# Patient Record
Sex: Female | Born: 1945 | Race: White | Hispanic: No | Marital: Married | Smoking: Never smoker
Health system: Southern US, Community
[De-identification: ages and names within clinical notes are randomized; demographics above are authoritative.]

## PROBLEM LIST (undated history)

## (undated) DIAGNOSIS — E041 Nontoxic single thyroid nodule: Secondary | ICD-10-CM

## (undated) DIAGNOSIS — C8590 Non-Hodgkin lymphoma, unspecified, unspecified site: Secondary | ICD-10-CM

## (undated) DIAGNOSIS — K5792 Diverticulitis of intestine, part unspecified, without perforation or abscess without bleeding: Secondary | ICD-10-CM

## (undated) DIAGNOSIS — G4733 Obstructive sleep apnea (adult) (pediatric): Secondary | ICD-10-CM

## (undated) DIAGNOSIS — I10 Essential (primary) hypertension: Secondary | ICD-10-CM

## (undated) DIAGNOSIS — E039 Hypothyroidism, unspecified: Secondary | ICD-10-CM

## (undated) DIAGNOSIS — K219 Gastro-esophageal reflux disease without esophagitis: Secondary | ICD-10-CM

## (undated) DIAGNOSIS — G4700 Insomnia, unspecified: Secondary | ICD-10-CM

## (undated) DIAGNOSIS — F329 Major depressive disorder, single episode, unspecified: Secondary | ICD-10-CM

## (undated) DIAGNOSIS — K449 Diaphragmatic hernia without obstruction or gangrene: Secondary | ICD-10-CM

## (undated) DIAGNOSIS — R350 Frequency of micturition: Secondary | ICD-10-CM

## (undated) DIAGNOSIS — N390 Urinary tract infection, site not specified: Secondary | ICD-10-CM

## (undated) DIAGNOSIS — R351 Nocturia: Secondary | ICD-10-CM

## (undated) DIAGNOSIS — M1990 Unspecified osteoarthritis, unspecified site: Secondary | ICD-10-CM

## (undated) DIAGNOSIS — E785 Hyperlipidemia, unspecified: Secondary | ICD-10-CM

## (undated) DIAGNOSIS — J189 Pneumonia, unspecified organism: Secondary | ICD-10-CM

## (undated) DIAGNOSIS — J40 Bronchitis, not specified as acute or chronic: Secondary | ICD-10-CM

## (undated) DIAGNOSIS — F32A Depression, unspecified: Secondary | ICD-10-CM

## (undated) DIAGNOSIS — Z9989 Dependence on other enabling machines and devices: Secondary | ICD-10-CM

## (undated) DIAGNOSIS — R3915 Urgency of urination: Secondary | ICD-10-CM

## (undated) DIAGNOSIS — R32 Unspecified urinary incontinence: Secondary | ICD-10-CM

## (undated) DIAGNOSIS — G4730 Sleep apnea, unspecified: Secondary | ICD-10-CM

## (undated) DIAGNOSIS — G43909 Migraine, unspecified, not intractable, without status migrainosus: Secondary | ICD-10-CM

## (undated) DIAGNOSIS — N3642 Intrinsic sphincter deficiency (ISD): Secondary | ICD-10-CM

## (undated) HISTORY — PX: COLONOSCOPY: SHX174

## (undated) HISTORY — PX: BUNIONECTOMY: SHX129

## (undated) HISTORY — PX: TONSILLECTOMY: SUR1361

## (undated) HISTORY — DX: Hypothyroidism, unspecified: E03.9

## (undated) HISTORY — DX: Non-Hodgkin lymphoma, unspecified, unspecified site: C85.90

## (undated) HISTORY — DX: Diaphragmatic hernia without obstruction or gangrene: K44.9

## (undated) HISTORY — DX: Diverticulitis of intestine, part unspecified, without perforation or abscess without bleeding: K57.92

## (undated) HISTORY — DX: Sleep apnea, unspecified: G47.30

---

## 1987-06-10 HISTORY — PX: CHOLECYSTECTOMY: SHX55

## 1992-06-09 HISTORY — PX: ANTERIOR AND POSTERIOR REPAIR: SHX1172

## 1998-01-10 ENCOUNTER — Other Ambulatory Visit: Admission: RE | Admit: 1998-01-10 | Discharge: 1998-01-10 | Payer: Self-pay | Admitting: Gynecology

## 1999-02-04 ENCOUNTER — Other Ambulatory Visit: Admission: RE | Admit: 1999-02-04 | Discharge: 1999-02-04 | Payer: Self-pay | Admitting: Gynecology

## 1999-11-28 ENCOUNTER — Encounter: Payer: Self-pay | Admitting: Gynecology

## 1999-11-28 ENCOUNTER — Encounter: Admission: RE | Admit: 1999-11-28 | Discharge: 1999-11-28 | Payer: Self-pay | Admitting: Gynecology

## 2000-02-06 ENCOUNTER — Other Ambulatory Visit: Admission: RE | Admit: 2000-02-06 | Discharge: 2000-02-06 | Payer: Self-pay | Admitting: Gynecology

## 2000-04-02 ENCOUNTER — Other Ambulatory Visit: Admission: RE | Admit: 2000-04-02 | Discharge: 2000-04-02 | Payer: Self-pay | Admitting: Gynecology

## 2000-04-02 ENCOUNTER — Encounter (INDEPENDENT_AMBULATORY_CARE_PROVIDER_SITE_OTHER): Payer: Self-pay | Admitting: Specialist

## 2000-11-30 ENCOUNTER — Encounter: Payer: Self-pay | Admitting: Gynecology

## 2000-11-30 ENCOUNTER — Encounter: Admission: RE | Admit: 2000-11-30 | Discharge: 2000-11-30 | Payer: Self-pay | Admitting: Gynecology

## 2001-03-12 ENCOUNTER — Other Ambulatory Visit: Admission: RE | Admit: 2001-03-12 | Discharge: 2001-03-12 | Payer: Self-pay | Admitting: Gynecology

## 2001-12-08 ENCOUNTER — Encounter: Payer: Self-pay | Admitting: Gynecology

## 2001-12-08 ENCOUNTER — Encounter: Admission: RE | Admit: 2001-12-08 | Discharge: 2001-12-08 | Payer: Self-pay | Admitting: Gynecology

## 2002-04-12 ENCOUNTER — Other Ambulatory Visit: Admission: RE | Admit: 2002-04-12 | Discharge: 2002-04-12 | Payer: Self-pay | Admitting: Gynecology

## 2002-12-14 ENCOUNTER — Encounter: Admission: RE | Admit: 2002-12-14 | Discharge: 2002-12-14 | Payer: Self-pay | Admitting: Gynecology

## 2002-12-14 ENCOUNTER — Encounter: Payer: Self-pay | Admitting: Gynecology

## 2003-04-21 ENCOUNTER — Other Ambulatory Visit: Admission: RE | Admit: 2003-04-21 | Discharge: 2003-04-21 | Payer: Self-pay | Admitting: Gynecology

## 2003-12-27 ENCOUNTER — Encounter: Admission: RE | Admit: 2003-12-27 | Discharge: 2003-12-27 | Payer: Self-pay | Admitting: Gynecology

## 2004-05-29 ENCOUNTER — Other Ambulatory Visit: Admission: RE | Admit: 2004-05-29 | Discharge: 2004-05-29 | Payer: Self-pay | Admitting: Gynecology

## 2005-01-22 ENCOUNTER — Encounter: Admission: RE | Admit: 2005-01-22 | Discharge: 2005-01-22 | Payer: Self-pay | Admitting: Gynecology

## 2005-07-23 ENCOUNTER — Other Ambulatory Visit: Admission: RE | Admit: 2005-07-23 | Discharge: 2005-07-23 | Payer: Self-pay | Admitting: Gynecology

## 2006-01-26 ENCOUNTER — Encounter: Admission: RE | Admit: 2006-01-26 | Discharge: 2006-01-26 | Payer: Self-pay | Admitting: Gynecology

## 2006-08-10 ENCOUNTER — Other Ambulatory Visit: Admission: RE | Admit: 2006-08-10 | Discharge: 2006-08-10 | Payer: Self-pay | Admitting: Gynecology

## 2007-02-04 ENCOUNTER — Encounter: Admission: RE | Admit: 2007-02-04 | Discharge: 2007-02-04 | Payer: Self-pay | Admitting: Gynecology

## 2007-02-11 ENCOUNTER — Encounter: Admission: RE | Admit: 2007-02-11 | Discharge: 2007-02-11 | Payer: Self-pay | Admitting: Gynecology

## 2007-06-10 HISTORY — PX: DILATION AND CURETTAGE OF UTERUS: SHX78

## 2007-09-14 ENCOUNTER — Encounter (INDEPENDENT_AMBULATORY_CARE_PROVIDER_SITE_OTHER): Payer: Self-pay | Admitting: Gynecology

## 2007-09-14 ENCOUNTER — Ambulatory Visit (HOSPITAL_BASED_OUTPATIENT_CLINIC_OR_DEPARTMENT_OTHER): Admission: RE | Admit: 2007-09-14 | Discharge: 2007-09-14 | Payer: Self-pay | Admitting: Gynecology

## 2008-02-28 ENCOUNTER — Encounter: Admission: RE | Admit: 2008-02-28 | Discharge: 2008-02-28 | Payer: Self-pay | Admitting: Gynecology

## 2009-08-22 ENCOUNTER — Encounter: Admission: RE | Admit: 2009-08-22 | Discharge: 2009-08-22 | Payer: Self-pay | Admitting: Gynecology

## 2010-07-01 ENCOUNTER — Encounter: Payer: Self-pay | Admitting: Gynecology

## 2010-09-18 ENCOUNTER — Other Ambulatory Visit: Payer: Self-pay | Admitting: Gynecology

## 2010-09-18 DIAGNOSIS — Z1231 Encounter for screening mammogram for malignant neoplasm of breast: Secondary | ICD-10-CM

## 2010-10-14 ENCOUNTER — Ambulatory Visit
Admission: RE | Admit: 2010-10-14 | Discharge: 2010-10-14 | Disposition: A | Discharge status: Home / Self Care | Payer: Medicare Other | Source: Ambulatory Visit | Attending: Gynecology | Admitting: Gynecology

## 2010-10-14 DIAGNOSIS — Z1231 Encounter for screening mammogram for malignant neoplasm of breast: Secondary | ICD-10-CM

## 2010-10-21 ENCOUNTER — Other Ambulatory Visit: Payer: Self-pay | Admitting: Gynecology

## 2010-10-25 NOTE — Op Note (Signed)
Hailey [name], Hailey [name]                 ACCOUNT NO.:  [id number]   MEDICAL RECORD NO.:  [id number]          PATIENT TYPE:  AMB   LOCATION:  [facility]                         FACILITY:  [facility]   PHYSICIAN:  Gretta Cool, M.D. DATE OF BIRTH:  12/12/1945   DATE OF PROCEDURE:  09/19/2007  DATE OF DISCHARGE:                               OPERATIVE REPORT   PREOPERATIVE DIAGNOSIS:  Endometrial polyp by saline infusion sonography  with previous polyp demonstrated by endometrial sampling.   POSTOPERATIVE DIAGNOSIS:  Endometrial polyp by saline infusion  sonography with previous polyp demonstrated by endometrial sampling.   PROCEDURE:  Hysteroscopy, abandoned because of difficulty with fluid  return and clearing of the field and because of difficulty monitoring  pressure input and outflow.   SURGEON:  Gretta Cool, M.D.   ANESTHESIA:  MAC and local paracervical block.   DESCRIPTION OF PROCEDURE:  With the patient prepped and draped in the  lithotomy position in [place] stirrups, with her bladder drained by  catheter.  The cervix was grasped with some difficulty.  She has had  previous vaginal repair surgery with stenosis of the upper vaginal vault  and difficult access to the cervix.  The cervix was eventually  sufficiently isolated and visualized to allow dilation of the cervix  with Pratt dilators to accommodate the 7 mm resectoscope.  The uterine  cavity was sounded and with no evidence of perforation nor sounding  beyond 8 cm.  The cavity was then examined but great difficulty was  encountered in getting outflow of the fluid from the endometrial cavity  into the ports of the hysteroscope.  It was very difficult to get the  circulation adequate to cause clearing of the field.  Only a brief  glimpses of the polyp could be obtained.  There was no evidence of  perforation but the flow monitoring device went from a surplus of 20 mL  of fluid to more than 600 mL deficit within a matter of a few  minutes.  The procedure was paused and the fluid in-and-out actually measured and  was measured to be approximately 300 mL.  At this point, the procedure  was continued but again the field could not be cleared and there  continued to be more evidence of loss than fluid returned.  At this  point, because of concern over possible leak from an unseen perforation  of the uterus, the procedure was terminated.  I was able to biopsy the  endometrial polyp present in the cavity and submitted that for  pathologic exam.  At the end of the procedure there were no  complications.  Minimal blood loss.  Fluid deficit estimated to be 300  mL.  The patient returned to the recovery room in excellent condition  without significant abdominal discomfort or concern.  Note,  culdocentesis revealed no fluid return from the cul-de-sac.           ______________________________  Gretta Cool, M.D.     CWL/MEDQ  D:  09/14/2007  T:  09/14/2007  Job:  [id number]

## 2010-12-17 ENCOUNTER — Other Ambulatory Visit (HOSPITAL_COMMUNITY): Payer: Self-pay | Admitting: Family Medicine

## 2010-12-17 DIAGNOSIS — R591 Generalized enlarged lymph nodes: Secondary | ICD-10-CM

## 2010-12-18 ENCOUNTER — Ambulatory Visit (HOSPITAL_COMMUNITY)
Admission: RE | Admit: 2010-12-18 | Discharge: 2010-12-18 | Disposition: A | Discharge status: Home / Self Care | Payer: Medicare Other | Source: Ambulatory Visit | Attending: Family Medicine | Admitting: Family Medicine

## 2010-12-18 DIAGNOSIS — R591 Generalized enlarged lymph nodes: Secondary | ICD-10-CM

## 2010-12-25 ENCOUNTER — Other Ambulatory Visit: Payer: Self-pay | Admitting: Interventional Radiology

## 2010-12-25 ENCOUNTER — Encounter (HOSPITAL_COMMUNITY): Payer: Self-pay

## 2010-12-25 ENCOUNTER — Ambulatory Visit (HOSPITAL_COMMUNITY)
Admission: RE | Admit: 2010-12-25 | Discharge: 2010-12-25 | Disposition: A | Discharge status: Home / Self Care | Payer: Medicare Other | Source: Ambulatory Visit | Attending: Family Medicine | Admitting: Family Medicine

## 2010-12-25 DIAGNOSIS — R599 Enlarged lymph nodes, unspecified: Secondary | ICD-10-CM | POA: Insufficient documentation

## 2010-12-25 HISTORY — DX: Essential (primary) hypertension: I10

## 2010-12-25 HISTORY — PX: OTHER SURGICAL HISTORY: SHX169

## 2010-12-25 LAB — PROTIME-INR: Prothrombin Time: 13.6 seconds (ref 11.6–15.2)

## 2010-12-25 LAB — CBC
Hemoglobin: 14.1 g/dL (ref 12.0–15.0)
MCH: 30.5 pg (ref 26.0–34.0)
MCV: 88.3 fL (ref 78.0–100.0)
RBC: 4.63 MIL/uL (ref 3.87–5.11)

## 2011-01-10 ENCOUNTER — Other Ambulatory Visit (HOSPITAL_COMMUNITY): Payer: Self-pay | Admitting: Internal Medicine

## 2011-01-10 DIAGNOSIS — C8590 Non-Hodgkin lymphoma, unspecified, unspecified site: Secondary | ICD-10-CM

## 2011-01-16 ENCOUNTER — Other Ambulatory Visit (HOSPITAL_COMMUNITY): Payer: Self-pay | Admitting: Internal Medicine

## 2011-01-16 ENCOUNTER — Encounter (HOSPITAL_COMMUNITY)
Admission: RE | Admit: 2011-01-16 | Discharge: 2011-01-16 | Disposition: A | Discharge status: Home / Self Care | Payer: Medicare Other | Source: Ambulatory Visit | Attending: Internal Medicine | Admitting: Internal Medicine

## 2011-01-16 DIAGNOSIS — E041 Nontoxic single thyroid nodule: Secondary | ICD-10-CM | POA: Insufficient documentation

## 2011-01-16 DIAGNOSIS — C8583 Other specified types of non-Hodgkin lymphoma, intra-abdominal lymph nodes: Secondary | ICD-10-CM | POA: Insufficient documentation

## 2011-01-16 DIAGNOSIS — C8590 Non-Hodgkin lymphoma, unspecified, unspecified site: Secondary | ICD-10-CM

## 2011-01-16 DIAGNOSIS — N281 Cyst of kidney, acquired: Secondary | ICD-10-CM | POA: Insufficient documentation

## 2011-01-16 DIAGNOSIS — K449 Diaphragmatic hernia without obstruction or gangrene: Secondary | ICD-10-CM | POA: Insufficient documentation

## 2011-01-16 LAB — GLUCOSE, CAPILLARY: Glucose-Capillary: 116 mg/dL — ABNORMAL HIGH (ref 70–99)

## 2011-01-16 MED ORDER — FLUDEOXYGLUCOSE F - 18 (FDG) INJECTION
18.5000 | Freq: Once | INTRAVENOUS | Status: AC | PRN
  Administered 2011-01-16: 18.5 via INTRAVENOUS

## 2011-03-04 LAB — POCT I-STAT, CHEM 8
BUN: 19
Chloride: 107
HCT: 40
Sodium: 145
TCO2: 28

## 2011-07-11 ENCOUNTER — Ambulatory Visit (INDEPENDENT_AMBULATORY_CARE_PROVIDER_SITE_OTHER): Payer: Medicare Other | Admitting: Urology

## 2011-07-11 DIAGNOSIS — N393 Stress incontinence (female) (male): Secondary | ICD-10-CM | POA: Diagnosis not present

## 2011-07-11 DIAGNOSIS — N8111 Cystocele, midline: Secondary | ICD-10-CM | POA: Diagnosis not present

## 2011-07-11 DIAGNOSIS — N3946 Mixed incontinence: Secondary | ICD-10-CM

## 2011-07-11 DIAGNOSIS — N952 Postmenopausal atrophic vaginitis: Secondary | ICD-10-CM

## 2011-07-23 DIAGNOSIS — K449 Diaphragmatic hernia without obstruction or gangrene: Secondary | ICD-10-CM | POA: Diagnosis not present

## 2011-07-23 DIAGNOSIS — Z79899 Other long term (current) drug therapy: Secondary | ICD-10-CM | POA: Diagnosis not present

## 2011-07-23 DIAGNOSIS — C8299 Follicular lymphoma, unspecified, extranodal and solid organ sites: Secondary | ICD-10-CM | POA: Diagnosis not present

## 2011-07-23 DIAGNOSIS — E079 Disorder of thyroid, unspecified: Secondary | ICD-10-CM | POA: Diagnosis not present

## 2011-07-24 DIAGNOSIS — E079 Disorder of thyroid, unspecified: Secondary | ICD-10-CM | POA: Diagnosis not present

## 2011-07-24 DIAGNOSIS — C8299 Follicular lymphoma, unspecified, extranodal and solid organ sites: Secondary | ICD-10-CM | POA: Diagnosis not present

## 2011-07-24 DIAGNOSIS — Z79899 Other long term (current) drug therapy: Secondary | ICD-10-CM | POA: Diagnosis not present

## 2011-08-18 DIAGNOSIS — N393 Stress incontinence (female) (male): Secondary | ICD-10-CM | POA: Diagnosis not present

## 2011-09-24 ENCOUNTER — Other Ambulatory Visit: Payer: Self-pay | Admitting: Gynecology

## 2011-09-24 DIAGNOSIS — Z1231 Encounter for screening mammogram for malignant neoplasm of breast: Secondary | ICD-10-CM

## 2011-10-10 ENCOUNTER — Ambulatory Visit (INDEPENDENT_AMBULATORY_CARE_PROVIDER_SITE_OTHER): Payer: Medicare Other | Admitting: Urology

## 2011-10-10 DIAGNOSIS — N3946 Mixed incontinence: Secondary | ICD-10-CM | POA: Diagnosis not present

## 2011-10-10 DIAGNOSIS — N3642 Intrinsic sphincter deficiency (ISD): Secondary | ICD-10-CM

## 2011-10-10 DIAGNOSIS — N3289 Other specified disorders of bladder: Secondary | ICD-10-CM | POA: Diagnosis not present

## 2011-10-14 DIAGNOSIS — R82998 Other abnormal findings in urine: Secondary | ICD-10-CM | POA: Diagnosis not present

## 2011-10-15 DIAGNOSIS — R82998 Other abnormal findings in urine: Secondary | ICD-10-CM | POA: Diagnosis not present

## 2011-10-16 ENCOUNTER — Ambulatory Visit
Admission: RE | Admit: 2011-10-16 | Discharge: 2011-10-16 | Disposition: A | Discharge status: Home / Self Care | Payer: Medicare Other | Source: Ambulatory Visit | Attending: Gynecology | Admitting: Gynecology

## 2011-10-16 DIAGNOSIS — Z1231 Encounter for screening mammogram for malignant neoplasm of breast: Secondary | ICD-10-CM

## 2011-10-21 ENCOUNTER — Other Ambulatory Visit: Payer: Self-pay | Admitting: Urology

## 2011-10-22 ENCOUNTER — Encounter (HOSPITAL_BASED_OUTPATIENT_CLINIC_OR_DEPARTMENT_OTHER): Payer: Self-pay | Admitting: *Deleted

## 2011-10-22 DIAGNOSIS — M899 Disorder of bone, unspecified: Secondary | ICD-10-CM | POA: Diagnosis not present

## 2011-10-22 DIAGNOSIS — M949 Disorder of cartilage, unspecified: Secondary | ICD-10-CM | POA: Diagnosis not present

## 2011-10-22 DIAGNOSIS — R82998 Other abnormal findings in urine: Secondary | ICD-10-CM | POA: Diagnosis not present

## 2011-10-22 DIAGNOSIS — N8184 Pelvic muscle wasting: Secondary | ICD-10-CM | POA: Diagnosis not present

## 2011-10-24 ENCOUNTER — Encounter (HOSPITAL_BASED_OUTPATIENT_CLINIC_OR_DEPARTMENT_OTHER): Payer: Self-pay | Admitting: *Deleted

## 2011-10-24 NOTE — Progress Notes (Signed)
NPO AFTER MN. ARRIVES AT 0815. NEEDS ISTAT AND EKG. WILL TAKE ZOLOFT, ZOCOR, DILTIAZEM, AND PRILOSEC AM OF SURG. W/ SIPS OF WATER.

## 2011-10-27 NOTE — H&P (Signed)
Hailey [name] is a 66 yo WF sent in consultation by Dr. Selinda Flavin for urinary incontinence.  She has had problems for years and had a bladder tack over 20 years ago by Dr. Phyllis Ginger which helped the incontinence.  She has had progressive problems since.  She leaks with coughing, sneezing and lifting.  She has some UUI.  She has some daytime frequency but no nocturia if she wears her CPAP machine.  She has had occasional dysuria but no hematuria.  She has no hesitancy but has a reduced stream.  She thinks she empties well.  She has no prolapse symptoms.  She is G3P3 NVD 3.  She has not had a hysterectomy.  She has had an assymptomatic UTI at each of her last 2 annual physicals with Dr. Nicholas Lose.  She had a renal US by Dr. 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  She is currently on surveillance.   She has been on estrogen cream in the past for atrophic vaginitis.   Past Medical History Problems  1. History of  Adult Sleep Apnea 780.57 2. History of  Anxiety (Symptom) 300.00 3. History of  Arthritis V13.4 4. History of  Depression 311 5. History of  Esophageal Reflux 530.81 6. History of  Hypercholesterolemia 272.0 7. History of  Hyperlipidemia 272.4 8. History of  Hypertension 401.9 9. History of  Non-Hodgkin's Lymphoma 202.80 10. History of  Obesity 278.00  Surgical History Problems  1. History of  Anterior Colporrhaphy, Repair Of Cystocele 2. History of  Cholecystectomy 3. History of  Hallux Valgus (Bunion) Correction Left 4. History of  Hallux Valgus (Bunion) Correction Right 5. History of  Tonsillectomy  Current Meds 1. Acyclovir 400 MG Oral Tablet; Therapy: (Recorded:01Feb2013) to 2. Ciprofloxacin HCl 250 MG Oral Tablet; TAKE 1 TABLET BID; Therapy: 17May2013 to  (Complete:24May2013)  Requested for: 17May2013; Last Rx:17May2013 3. Diltiazem HCl TABS; Therapy: (Recorded:01Feb2013) to 4. Estrace 0.1  MG/GM Vaginal Cream; 1/2 applicatorful at bedtime 3 times weekly; Therapy:  01Feb2013 to (Last Rx:01Feb2013)  Requested for: 01Feb2013 5. Omeprazole 20 MG Oral Tablet Delayed Release; Therapy: (Recorded:01Feb2013) to 6. Sertraline HCl 25 MG Oral Tablet; Therapy: (Recorded:01Feb2013) to 7. Simvastatin 40 MG Oral Tablet; Therapy: (Recorded:01Feb2013) to  Allergies Medication  1. No Known Drug Allergies  Family History Problems  1. Family history of  Alzheimer's Disease 2. Family history of  CABG (CABG) 3. Family history of  Cancer 4. Family history of  Cardiac Arrest 5. Family history of  Death In The Family Father 55 yo - Cardiac arrest 6. Family history of  Death In The Family Mother 54 yo - CA 7. Family history of  Diabetes Mellitus V18.0 8. Family history of  Family Health Status Number Of Children 1 son. 1 dtrs  Social History Problems  1. Caffeine Use 3/d 2. Marital History - Currently Married 3. Never A Smoker 4. Occupation: Retired Nurse, children's  5. History of  Alcohol Use  Review of Systems Genitourinary, constitutional, skin, eye, otolaryngeal, hematologic/lymphatic, cardiovascular, pulmonary, endocrine, musculoskeletal, gastrointestinal, neurological and psychiatric system(s) were reviewed and pertinent findings if present are noted.  Genitourinary: urinary frequency, urinary urgency, nocturia and incontinence, but no incomplete emptying of bladder.  Gastrointestinal: heartburn and constipation.  Constitutional: night sweats and feeling tired (fatigue).  Respiratory: shortness of breath.  Endocrine: polydipsia.  Musculoskeletal: joint pain.  Psychiatric: anxiety.    Physical  Exam Constitutional: Well nourished and well developed . No acute distress.  ENT:. The ears and nose are normal in appearance.  Neck: The appearance of the neck is normal and no neck mass is present.  Pulmonary: No respiratory distress and normal respiratory rhythm and effort.  Cardiovascular:  Heart rate and rhythm are normal . No peripheral edema.  Abdomen: The abdomen is obese. The abdomen is soft and nontender. No masses are palpated. No CVA tenderness. No hernias are palpable. No hepatosplenomegaly noted.  Genitourinary: Examination of the external genitalia shows normal female external genitalia and no lesions. The urethra is normal in appearance and not tender. There is no urethral mass. Urethral hypermobility is present. (with mild leakage in the lithotomy and standing position). Vaginal exam demonstrates atrophy and the vaginal epithelium to be poorly estrogenized, but no abnormalities. A cystocele is present with a lateral defect (grade 2 /4). No rectocele is identified. The cervix is is without abnormalities. The uterus is without abnormalities. The adnexa are palpably normal. The bladder is normal on palpation, non tender and not distended. The anus is normal on inspection. The perineum is normal on inspection.  Lymphatics: The femoral and inguinal nodes are not enlarged or tender.  Skin: Normal skin turgor, no visible rash and no visible skin lesions.  Neuro/Psych:. Mood and affect are appropriate.    Results/Data  The following images/tracing/specimen were independently visualized:  I have reviewed her UDS which shows ISD with SUI.    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 Urge And Stress Incontinence 788.33 3. Cystocele 596.89    She has ISD with incontinence and a small cystocele.   Plan  I have reviewed her options and she has elected to go with Macroplastique.  The risks of bleeding, infection, persistent incontinence, outlet obstruction with possible retention, need for second procedures, thrombotic events and anesthetic risks reviewed.

## 2011-10-28 ENCOUNTER — Encounter (HOSPITAL_BASED_OUTPATIENT_CLINIC_OR_DEPARTMENT_OTHER): Payer: Self-pay | Admitting: *Deleted

## 2011-10-28 ENCOUNTER — Encounter (HOSPITAL_BASED_OUTPATIENT_CLINIC_OR_DEPARTMENT_OTHER): Payer: Self-pay | Admitting: Anesthesiology

## 2011-10-28 ENCOUNTER — Ambulatory Visit (HOSPITAL_BASED_OUTPATIENT_CLINIC_OR_DEPARTMENT_OTHER)
Admission: RE | Admit: 2011-10-28 | Discharge: 2011-10-28 | Disposition: A | Discharge status: Home / Self Care | Payer: Medicare Other | Source: Ambulatory Visit | Attending: Urology | Admitting: Urology

## 2011-10-28 ENCOUNTER — Encounter (HOSPITAL_BASED_OUTPATIENT_CLINIC_OR_DEPARTMENT_OTHER)
Admission: RE | Disposition: A | Discharge status: Home / Self Care | Payer: Self-pay | Source: Ambulatory Visit | Attending: Urology

## 2011-10-28 ENCOUNTER — Ambulatory Visit (HOSPITAL_BASED_OUTPATIENT_CLINIC_OR_DEPARTMENT_OTHER): Payer: Medicare Other | Admitting: Anesthesiology

## 2011-10-28 DIAGNOSIS — G4730 Sleep apnea, unspecified: Secondary | ICD-10-CM | POA: Insufficient documentation

## 2011-10-28 DIAGNOSIS — K219 Gastro-esophageal reflux disease without esophagitis: Secondary | ICD-10-CM | POA: Diagnosis not present

## 2011-10-28 DIAGNOSIS — E785 Hyperlipidemia, unspecified: Secondary | ICD-10-CM | POA: Insufficient documentation

## 2011-10-28 DIAGNOSIS — Z79899 Other long term (current) drug therapy: Secondary | ICD-10-CM | POA: Diagnosis not present

## 2011-10-28 DIAGNOSIS — N393 Stress incontinence (female) (male): Secondary | ICD-10-CM | POA: Insufficient documentation

## 2011-10-28 DIAGNOSIS — N3642 Intrinsic sphincter deficiency (ISD): Secondary | ICD-10-CM | POA: Diagnosis not present

## 2011-10-28 DIAGNOSIS — E7800 Pure hypercholesterolemia, unspecified: Secondary | ICD-10-CM | POA: Insufficient documentation

## 2011-10-28 HISTORY — DX: Frequency of micturition: R35.0

## 2011-10-28 HISTORY — DX: Dependence on other enabling machines and devices: Z99.89

## 2011-10-28 HISTORY — DX: Nontoxic single thyroid nodule: E04.1

## 2011-10-28 HISTORY — DX: Gastro-esophageal reflux disease without esophagitis: K21.9

## 2011-10-28 HISTORY — DX: Nocturia: R35.1

## 2011-10-28 HISTORY — DX: Unspecified urinary incontinence: R32

## 2011-10-28 HISTORY — DX: Obstructive sleep apnea (adult) (pediatric): G47.33

## 2011-10-28 HISTORY — DX: Urgency of urination: R39.15

## 2011-10-28 HISTORY — DX: Urinary tract infection, site not specified: N39.0

## 2011-10-28 HISTORY — DX: Unspecified osteoarthritis, unspecified site: M19.90

## 2011-10-28 HISTORY — DX: Insomnia, unspecified: G47.00

## 2011-10-28 HISTORY — DX: Hyperlipidemia, unspecified: E78.5

## 2011-10-28 HISTORY — PX: CYSTOSCOPY WITH INJECTION: SHX1424

## 2011-10-28 HISTORY — DX: Intrinsic sphincter deficiency (ISD): N36.42

## 2011-10-28 HISTORY — DX: Non-Hodgkin lymphoma, unspecified, unspecified site: C85.90

## 2011-10-28 LAB — POCT I-STAT 4, (NA,K, GLUC, HGB,HCT)
HCT: 43 % (ref 36.0–46.0)
Hemoglobin: 14.6 g/dL (ref 12.0–15.0)
Potassium: 3.6 mEq/L (ref 3.5–5.1)
Sodium: 142 mEq/L (ref 135–145)

## 2011-10-28 SURGERY — CYSTOSCOPY, WITH INJECTION OF BLADDER NECK OR BLADDER WALL
Anesthesia: Monitor Anesthesia Care | Site: Bladder | Wound class: Clean Contaminated

## 2011-10-28 MED ORDER — SODIUM CHLORIDE 0.9 % IV SOLN: 250.0000 mL | INTRAVENOUS | Status: DC | PRN

## 2011-10-28 MED ORDER — LACTATED RINGERS IV SOLN
INTRAVENOUS | Status: DC
  Administered 2011-10-28 (×2): via INTRAVENOUS

## 2011-10-28 MED ORDER — KETOROLAC TROMETHAMINE 30 MG/ML IJ SOLN: 15.0000 mg | Freq: Once | INTRAMUSCULAR | Status: DC | PRN

## 2011-10-28 MED ORDER — OXYCODONE HCL 5 MG PO TABS: 5.0000 mg | ORAL_TABLET | ORAL | Status: DC | PRN

## 2011-10-28 MED ORDER — CIPROFLOXACIN IN D5W 400 MG/200ML IV SOLN: 400.0000 mg | INTRAVENOUS | Status: DC

## 2011-10-28 MED ORDER — ACETAMINOPHEN 325 MG PO TABS: 650.0000 mg | ORAL_TABLET | ORAL | Status: DC | PRN

## 2011-10-28 MED ORDER — FENTANYL CITRATE 0.05 MG/ML IJ SOLN
INTRAMUSCULAR | Status: DC | PRN
  Administered 2011-10-28: 10 ug via INTRAVENOUS
  Administered 2011-10-28: 50 ug via INTRAVENOUS

## 2011-10-28 MED ORDER — SODIUM CHLORIDE 0.9 % IJ SOLN: 3.0000 mL | INTRAMUSCULAR | Status: DC | PRN

## 2011-10-28 MED ORDER — PROPOFOL 10 MG/ML IV EMUL
INTRAVENOUS | Status: DC | PRN
  Administered 2011-10-28: 75 ug/kg/min via INTRAVENOUS

## 2011-10-28 MED ORDER — FENTANYL CITRATE 0.05 MG/ML IJ SOLN: 25.0000 ug | INTRAMUSCULAR | Status: DC | PRN

## 2011-10-28 MED ORDER — PROMETHAZINE HCL 25 MG/ML IJ SOLN: 6.2500 mg | INTRAMUSCULAR | Status: DC | PRN

## 2011-10-28 MED ORDER — SODIUM CHLORIDE 0.9 % IJ SOLN: 3.0000 mL | Freq: Two times a day (BID) | INTRAMUSCULAR | Status: DC

## 2011-10-28 MED ORDER — LIDOCAINE HCL (CARDIAC) 20 MG/ML IV SOLN
INTRAVENOUS | Status: DC | PRN
  Administered 2011-10-28: 40 mg via INTRAVENOUS

## 2011-10-28 MED ORDER — PROPOFOL 10 MG/ML IV EMUL
INTRAVENOUS | Status: DC | PRN
  Administered 2011-10-28: 50 mg via INTRAVENOUS

## 2011-10-28 MED ORDER — ONDANSETRON HCL 4 MG/2ML IJ SOLN
INTRAMUSCULAR | Status: DC | PRN
  Administered 2011-10-28 (×4): 1 mg via INTRAVENOUS

## 2011-10-28 MED ORDER — ACETAMINOPHEN 650 MG RE SUPP: 650.0000 mg | RECTAL | Status: DC | PRN

## 2011-10-28 MED ORDER — STERILE WATER FOR IRRIGATION IR SOLN
Status: DC | PRN
  Administered 2011-10-28: 3000 mL via INTRAVESICAL

## 2011-10-28 MED ORDER — MIDAZOLAM HCL 5 MG/5ML IJ SOLN
INTRAMUSCULAR | Status: DC | PRN
  Administered 2011-10-28: 1 mg via INTRAVENOUS

## 2011-10-28 MED ORDER — ONDANSETRON HCL 4 MG/2ML IJ SOLN: 4.0000 mg | Freq: Four times a day (QID) | INTRAMUSCULAR | Status: DC | PRN

## 2011-10-28 MED ORDER — CIPROFLOXACIN HCL 250 MG PO TABS: 250.0000 mg | ORAL_TABLET | Freq: Two times a day (BID) | ORAL | Status: DC

## 2011-10-28 SURGICAL SUPPLY — 20 items
BAG DRAIN URO-CYSTO SKYTR STRL (DRAIN) ×2 IMPLANT
BAG DRN UROCATH (DRAIN) ×1
CANISTER SUCT LVC 12 LTR MEDI- (MISCELLANEOUS) ×1 IMPLANT
CATH ROBINSON RED A/P 12FR (CATHETERS) ×2 IMPLANT
CLOTH BEACON ORANGE TIMEOUT ST (SAFETY) ×2 IMPLANT
DRAPE CAMERA CLOSED 9X96 (DRAPES) ×2 IMPLANT
ELECT REM PT RETURN 9FT ADLT (ELECTROSURGICAL)
ELECTRODE REM PT RTRN 9FT ADLT (ELECTROSURGICAL) IMPLANT
GLOVE SURG SS PI 8.0 STRL IVOR (GLOVE) ×2 IMPLANT
GOWN PREVENTION PLUS LG XLONG (DISPOSABLE) ×2 IMPLANT
GOWN STRL REIN XL XLG (GOWN DISPOSABLE) ×2 IMPLANT
INJECTION MACROPLASIQ 2.5ML UN (Female Continence) IMPLANT
MACROPLASTIQUE 2.5ML UNIT (Female Continence) ×2 IMPLANT
NDL RIGID UROPLASTY (NEEDLE) IMPLANT
NDL SAFETY ECLIPSE 18X1.5 (NEEDLE) IMPLANT
NEEDLE HYPO 18GX1.5 SHARP (NEEDLE)
NEEDLE RIGID UROPLASTY (NEEDLE) ×2 IMPLANT
PACK CYSTOSCOPY (CUSTOM PROCEDURE TRAY) ×2 IMPLANT
SYR BULB IRRIGATION 50ML (SYRINGE) IMPLANT
WATER STERILE IRR 3000ML UROMA (IV SOLUTION) ×2 IMPLANT

## 2011-10-28 NOTE — Anesthesia Preprocedure Evaluation (Signed)
Anesthesia Evaluation  Patient identified by MRN, date of birth, ID band Patient awake    Reviewed: Allergy & Precautions, H&P , NPO status , Patient's Chart, lab work & pertinent test results  Airway Mallampati: II TM Distance: <3 FB Neck ROM: Full    Dental No notable dental hx.    Pulmonary neg pulmonary ROS,  breath sounds clear to auscultation  Pulmonary exam normal       Cardiovascular hypertension, Pt. on medications Rhythm:Regular Rate:Normal     Neuro/Psych negative neurological ROS  negative psych ROS   GI/Hepatic Neg liver ROS, GERD-  Medicated,  Endo/Other  Morbid obesity  Renal/GU negative Renal ROS  negative genitourinary   Musculoskeletal negative musculoskeletal ROS (+)   Abdominal (+) + obese,   Peds negative pediatric ROS (+)  Hematology negative hematology ROS (+) NHL   Anesthesia Other Findings   Reproductive/Obstetrics negative OB ROS                           Anesthesia Physical Anesthesia Plan  ASA: III  Anesthesia Plan: MAC   Post-op Pain Management:    Induction: Intravenous  Airway Management Planned: Simple Face Mask  Additional Equipment:   Intra-op Plan:   Post-operative Plan:   Informed Consent: I have reviewed the patients History and Physical, chart, labs and discussed the procedure including the risks, benefits and alternatives for the proposed anesthesia with the patient or authorized representative who has indicated his/her understanding and acceptance.   Dental advisory given  Plan Discussed with: CRNA  Anesthesia Plan Comments:         Anesthesia Quick Evaluation

## 2011-10-28 NOTE — Discharge Instructions (Addendum)
Collagen Implant (for urinary incontinence) Care After Read the instructions outlined below and refer to this sheet in the next few weeks. These discharge instructions provide you with general information on caring for yourself after you leave the hospital. Your caregiver may also give you specific instructions. While your treatment has been planned according to the most current medical practices available, unavoidable complications occasionally occur. If you have any problems or questions after discharge, call your caregiver. ACTIVITY  You may resume your regular activity.   Talk with your caregiver about when you may return to work.   You may shower.  NUTRITION  You may resume your normal diet.   Drink plenty of fluids (6-8 glasses a day).   Eat a well-balanced diet.   Call your doctor if you have persistent nausea or vomiting.  FEVER  If you feel feverish or have shaking chills, take your temperature. If it is 102 F (38.9 C), call your doctor.   Fever may mean there is an infection.  PAIN CONTROL  Only take over-the-counter or prescription medicines for pain, discomfort, or fever as directed by your caregiver.  SYMPTOMS AFTER SURGERY You may experience one or more of the following symptoms after your implant procedure. These symptoms should be only temporary:  Inability to urinate or a change in your usual pattern of urination.   Slight bleeding at the urethra or blood in the urine.   Soreness in the injection site area.   Nausea.  SEXUAL INTERCOURSE Check with your doctor at your follow-up visit about resuming sexual intercourse. SEEK IMMEDIATE MEDICAL CARE IF:   Cloudy foul-smelling urine.   Fever greater than 102 F (38.9 C).   Joint pain or joint swelling.   Skin rash or skin thickening.   Muscle aches.   Weakness or fatigue.   You are unable to urinate.  Document Released: 01/08/2004 Document Revised: 05/15/2011 Document Reviewed:  07/05/2007 [facility] Patient Information 2012 [place], [place]. Post Anesthesia Home Care Instructions  Activity: Get plenty of rest for the remainder of the day. A responsible adult should stay with you for 24 hours following the procedure.  For the next 24 hours, DO NOT: -Drive a car -Advertising copywriter -Drink alcoholic beverages -Take any medication unless instructed by your physician -Make any legal decisions or sign important papers.  Meals: Start with liquid foods such as gelatin or soup. Progress to regular foods as tolerated. Avoid greasy, spicy, heavy foods. If nausea and/or vomiting occur, drink only clear liquids until the nausea and/or vomiting subsides. Call your physician if vomiting continues.  Special Instructions/Symptoms: Your throat may feel dry or sore from the anesthesia or the breathing tube placed in your throat during surgery. If this causes discomfort, gargle with warm salt water. The discomfort should disappear within 24 hours.

## 2011-10-28 NOTE — Anesthesia Procedure Notes (Signed)
Procedure Name: MAC Date/Time: 10/28/2011 9:28 AM Performed by: Norva Pavlov Pre-anesthesia Checklist: Patient identified, Emergency Drugs available, Suction available, Patient being monitored and Timeout performed Patient Re-evaluated:Patient Re-evaluated prior to inductionOxygen Delivery Method: Simple face mask Intubation Type: IV induction Ventilation: Oral airway inserted - appropriate to patient size Airway Equipment and Method: Oral airway Placement Confirmation: positive ETCO2 and breath sounds checked- equal and bilateral Dental Injury: Teeth and Oropharynx as per pre-operative assessment

## 2011-10-28 NOTE — Transfer of Care (Signed)
Immediate Anesthesia Transfer of Care Note  Patient: Hailey [name]  Procedure(s) Performed: Procedure(s) (LRB): CYSTOSCOPY WITH INJECTION (N/A)  Patient Location: PACU  Anesthesia Type: MAC Level of Consciousness: drowsy   Airway & Oxygen Therapy: Patient Spontanous Breathing and Patient connected to face mask oxygen  Post-op Assessment: Report given to PACU RN and Post -op Vital signs reviewed and stable  Post vital signs: Reviewed and stable  Complications: No apparent anesthesia complications

## 2011-10-28 NOTE — Interval H&P Note (Signed)
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 After consideration of risks, benefits and other options for treatment, the patient has consented to  Procedure(s) (LRB): CYSTOSCOPY WITH INJECTION (N/A) as a surgical intervention .  The patients' history has been reviewed, patient examined, no change in status, stable for surgery.  I have reviewed the patients' chart and labs.  Questions were answered to the patient's satisfaction.     Tico Crotteau J

## 2011-10-28 NOTE — Progress Notes (Signed)
Patient voiding 25-50 cc's at a time with strong urgency. Bladder scan done showing bladder volume of >500 cc. Dr. Annabell Howells notified and received order to place foley. #14 FR foley placed with return of clear yellow urine. Patient tolerated well.

## 2011-10-28 NOTE — Brief Op Note (Signed)
10/28/2011  9:43 AM  PATIENT:  Hailey [name]  66 y.o. 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   Delay start of Pharmacological VTE agent (>24hrs) due to surgical blood loss or risk of bleeding: no

## 2011-10-29 DIAGNOSIS — N3642 Intrinsic sphincter deficiency (ISD): Secondary | ICD-10-CM | POA: Diagnosis not present

## 2011-10-29 DIAGNOSIS — N3946 Mixed incontinence: Secondary | ICD-10-CM | POA: Diagnosis not present

## 2011-10-29 NOTE — Anesthesia Postprocedure Evaluation (Signed)
  Anesthesia Post-op Note  Patient: Hailey [name]  Procedure(s) Performed: Procedure(s) (LRB): CYSTOSCOPY WITH INJECTION (N/A)  Patient Location: PACU  Anesthesia Type: General  Level of Consciousness: awake and alert   Airway and Oxygen Therapy: Patient Spontanous Breathing  Post-op Pain: mild  Post-op Assessment: Post-op Vital signs reviewed, Patient's Cardiovascular Status Stable, Respiratory Function Stable, Patent Airway and No signs of Nausea or vomiting  Post-op Vital Signs: stable  Complications: No apparent anesthesia complications

## 2011-10-30 NOTE — Op Note (Signed)
NAME:  Hailey [name], Hailey [name]                      ACCOUNT NO.:  MEDICAL RECORD NO.:  [id number]  LOCATION:                                 FACILITY:  PHYSICIAN:  Excell Seltzer. Annabell Howells, M.D.         DATE OF BIRTH:  DATE OF PROCEDURE: DATE OF DISCHARGE:                              OPERATIVE REPORT   PROCEDURE:  Cystoscopy with intraurethral implantation of Macroplastique.  PREOPERATIVE DIAGNOSIS:  Stress incontinence with intrinsic sphincter deficiency.  POSTOPERATIVE DIAGNOSIS:  Stress incontinence with intrinsic sphincter deficiency.  SURGEON:  Excell Seltzer. Annabell Howells, M.D.  ANESTHESIA:  MAC.  DRAINS:  None.  COMPLICATIONS:  None.  INDICATIONS:  [name] is a 66 year old white female with a history of stress incontinence with a very low leak point pressure of 63 consistent with intrinsic sphincter deficiency who has elected to undergo Macroplastiq implantation.  FINDINGS:  She had been on Cipro preoperatively for a positive urine culture, and took her last tablet this morning.  She was taken to the operating room, where she was placed in the lithotomy position and sedation were induced.  Her perineum and genitalia were prepped with Betadine solution.  She was draped in the usual sterile fashion.  The Macroplastique injection scope was prepared with the 12-degree lens and the offset injection scope was prepared.  The injection needle was primed with the initial syringe of Macroplastique.  The scope was inserted and inspection revealed a patulous urethra consistent with ISD examination.  The bladder revealed mild trabeculation.  No tumors, stones, or inflammation were noted.  Ureteral orifices were unremarkable.  Once the bladder had been inspected, the Macroplastique was injected initially at 3 o'clock at mid urethral level.  The needle was inserted at 45 degree angle and then turned parallel to the urethra after the 1st mark.  Approximately 1.25 mL of Macroplastique was then injected  with excellent bulging of the mucosa to the midline.  The needle was left in place for 30 seconds and then withdrawn.  A 2nd injection on the right site at 9 o'clock was then performed with the remaining contents of the 2.5 mL syringe.  This also produced excellent mucosa bulging.  After completion of the 2nd injection the needle was left indwelling for 30 seconds and then removed.  Inspection revealed excellent coaptation of the mucosa in the midline with the visual appearance of the urethra, I felt that an additional syringe was not indicated at this time. The injection scope was then removed and the bladder was drained with a 12-French red rubber catheter.  The patient was taken down from lithotomy position.  Her anesthetic was reversed.  She was moved to recovery in stable condition.  There were no complications.     Excell Seltzer. Annabell Howells, M.D.     JJW/MEDQ  D:  10/28/2011  T:  10/30/2011  Job:  [id number]

## 2011-10-31 ENCOUNTER — Encounter (HOSPITAL_BASED_OUTPATIENT_CLINIC_OR_DEPARTMENT_OTHER): Payer: Self-pay | Admitting: Urology

## 2011-11-04 ENCOUNTER — Encounter (HOSPITAL_BASED_OUTPATIENT_CLINIC_OR_DEPARTMENT_OTHER): Payer: Self-pay

## 2011-11-14 ENCOUNTER — Ambulatory Visit (INDEPENDENT_AMBULATORY_CARE_PROVIDER_SITE_OTHER): Payer: Medicare Other | Admitting: Urology

## 2011-11-14 DIAGNOSIS — R82998 Other abnormal findings in urine: Secondary | ICD-10-CM | POA: Diagnosis not present

## 2011-11-14 DIAGNOSIS — N3946 Mixed incontinence: Secondary | ICD-10-CM | POA: Diagnosis not present

## 2011-12-01 DIAGNOSIS — R609 Edema, unspecified: Secondary | ICD-10-CM | POA: Diagnosis not present

## 2011-12-01 DIAGNOSIS — I10 Essential (primary) hypertension: Secondary | ICD-10-CM | POA: Diagnosis not present

## 2012-01-14 DIAGNOSIS — N3946 Mixed incontinence: Secondary | ICD-10-CM | POA: Diagnosis not present

## 2012-01-14 DIAGNOSIS — Z87440 Personal history of urinary (tract) infections: Secondary | ICD-10-CM | POA: Diagnosis not present

## 2012-01-14 DIAGNOSIS — N3642 Intrinsic sphincter deficiency (ISD): Secondary | ICD-10-CM | POA: Diagnosis not present

## 2012-01-27 ENCOUNTER — Encounter: Payer: Medicare Other | Admitting: Internal Medicine

## 2012-01-27 DIAGNOSIS — C8589 Other specified types of non-Hodgkin lymphoma, extranodal and solid organ sites: Secondary | ICD-10-CM | POA: Diagnosis not present

## 2012-01-27 DIAGNOSIS — M1990 Unspecified osteoarthritis, unspecified site: Secondary | ICD-10-CM

## 2012-01-27 DIAGNOSIS — E875 Hyperkalemia: Secondary | ICD-10-CM

## 2012-02-02 DIAGNOSIS — M25569 Pain in unspecified knee: Secondary | ICD-10-CM | POA: Diagnosis not present

## 2012-02-02 DIAGNOSIS — M1710 Unilateral primary osteoarthritis, unspecified knee: Secondary | ICD-10-CM | POA: Diagnosis not present

## 2012-02-23 DIAGNOSIS — M1710 Unilateral primary osteoarthritis, unspecified knee: Secondary | ICD-10-CM | POA: Diagnosis not present

## 2012-02-25 DIAGNOSIS — M1710 Unilateral primary osteoarthritis, unspecified knee: Secondary | ICD-10-CM | POA: Diagnosis not present

## 2012-02-25 DIAGNOSIS — M7120 Synovial cyst of popliteal space [Baker], unspecified knee: Secondary | ICD-10-CM | POA: Diagnosis not present

## 2012-02-25 DIAGNOSIS — M2240 Chondromalacia patellae, unspecified knee: Secondary | ICD-10-CM | POA: Diagnosis not present

## 2012-02-25 DIAGNOSIS — IMO0002 Reserved for concepts with insufficient information to code with codable children: Secondary | ICD-10-CM | POA: Diagnosis not present

## 2012-02-25 DIAGNOSIS — S83289A Other tear of lateral meniscus, current injury, unspecified knee, initial encounter: Secondary | ICD-10-CM | POA: Diagnosis not present

## 2012-03-02 DIAGNOSIS — M1710 Unilateral primary osteoarthritis, unspecified knee: Secondary | ICD-10-CM | POA: Diagnosis not present

## 2012-04-13 ENCOUNTER — Encounter (HOSPITAL_COMMUNITY): Payer: Self-pay | Admitting: Pharmacist

## 2012-04-14 ENCOUNTER — Other Ambulatory Visit: Payer: Self-pay | Admitting: Physician Assistant

## 2012-04-14 ENCOUNTER — Encounter: Payer: Self-pay | Admitting: Physician Assistant

## 2012-04-14 DIAGNOSIS — R32 Unspecified urinary incontinence: Secondary | ICD-10-CM | POA: Insufficient documentation

## 2012-04-14 DIAGNOSIS — K219 Gastro-esophageal reflux disease without esophagitis: Secondary | ICD-10-CM

## 2012-04-14 DIAGNOSIS — C8590 Non-Hodgkin lymphoma, unspecified, unspecified site: Secondary | ICD-10-CM | POA: Insufficient documentation

## 2012-04-14 DIAGNOSIS — I10 Essential (primary) hypertension: Secondary | ICD-10-CM | POA: Insufficient documentation

## 2012-04-14 DIAGNOSIS — E041 Nontoxic single thyroid nodule: Secondary | ICD-10-CM | POA: Insufficient documentation

## 2012-04-14 DIAGNOSIS — E785 Hyperlipidemia, unspecified: Secondary | ICD-10-CM

## 2012-04-14 DIAGNOSIS — M1710 Unilateral primary osteoarthritis, unspecified knee: Secondary | ICD-10-CM | POA: Diagnosis not present

## 2012-04-14 DIAGNOSIS — G4733 Obstructive sleep apnea (adult) (pediatric): Secondary | ICD-10-CM

## 2012-04-14 DIAGNOSIS — G4700 Insomnia, unspecified: Secondary | ICD-10-CM

## 2012-04-14 DIAGNOSIS — M1990 Unspecified osteoarthritis, unspecified site: Secondary | ICD-10-CM | POA: Insufficient documentation

## 2012-04-14 DIAGNOSIS — N3642 Intrinsic sphincter deficiency (ISD): Secondary | ICD-10-CM

## 2012-04-14 NOTE — H&P (Signed)
TOTAL KNEE ADMISSION H&P  Patient is being admitted for left total knee arthroplasty.  Subjective:  Chief Complaint:left knee pain.  HPI: Hailey [name], 66 y.o. female, has a history of pain and functional disability in the left knee due to arthritis and has failed non-surgical conservative treatments for greater than 12 weeks to includeNSAID's and/or analgesics, corticosteriod injections, flexibility and strengthening excercises, supervised PT with diminished ADL's post treatment, use of assistive devices, weight reduction as appropriate and activity modification.  Onset of symptoms was gradual, starting 7 years ago with gradually worsening course since that time. The patient noted no past surgery on the left knee(s).  Patient currently rates pain in the left knee(s) at 8 out of 10 with activity. Patient has night pain, worsening of pain with activity and weight bearing, pain that interferes with activities of daily living, pain with passive range of motion, crepitus and joint swelling.  Patient has evidence of subchondral sclerosis, periarticular osteophytes and joint space narrowing by imaging studies. There is no active infection.  Patient Active Problem List   Diagnosis Date Noted  . Hypertension   . Hyperlipidemia   . Intrinsic urethral sphincter deficiency   . Non-Hodgkin lymphoma   . Right thyroid nodule   . OSA on CPAP   . Arthritis   . GERD (gastroesophageal reflux disease)   . Incontinence of urine   . Insomnia    Past Medical History  Diagnosis Date  . Hypertension   . Hyperlipidemia   . Intrinsic urethral sphincter deficiency   . Non-Hodgkin lymphoma DX  JULY 2012---  SMALL AND SLOW GROWING--  NO TX      FOLLOWED BY [facility]  . Right thyroid nodule ACCIDENTAL FINDING FROM PET SCAN  2012  . OSA on CPAP   . Arthritis KNEES AND HIPS  . GERD (gastroesophageal reflux disease)   . Frequency of urination   . Urgency of urination   . Incontinence of urine     . Nocturia   . UTI (lower urinary tract infection)   . Insomnia     Past Surgical History  Procedure Date  . Cholecystectomy 1989  . Tonsillectomy childhood  . Dilation and curettage of uterus 2009    hysteroscopy/ablasion  . Anterior and posterior repair 1994  . Bunionectomy JAN  2012  & SEPT 2010  . Left retroperitoneal lymph  node bx 12-25-2010  . Cystoscopy with injection 10/28/2011    Procedure: CYSTOSCOPY WITH INJECTION;  Surgeon: Anner Crete, MD;  Location: [facility];  Service: Urology;  Laterality: N/A;  MACRO PLASTIQUE     (Not in a hospital admission) No Known Allergies  Current Outpatient Prescriptions on File Prior to Visit  Medication Sig Dispense Refill  . Calcium Carb-Cholecalciferol (CALCIUM 600/VITAMIN D3) 600-800 MG-UNIT TABS Take 1 tablet by mouth every morning.      . diltiazem (TIAZAC) 300 MG 24 hr capsule Take 300 mg by mouth every morning.      Marland Kitchen estradiol (ESTRACE) 0.1 MG/GM vaginal cream Place 1 g vaginally 3 (three) times a week.      . hydrochlorothiazide (HYDRODIURIL) 25 MG tablet Take 25 mg by mouth every morning.      . meloxicam (MOBIC) 7.5 MG tablet Take 15 mg by mouth daily.      Marland Kitchen omeprazole (PRILOSEC) 20 MG capsule Take 20 mg by mouth every morning.      Marland Kitchen oxybutynin (DITROPAN-XL) 10 MG 24 hr tablet Take 10 mg by mouth  every morning.      . sertraline (ZOLOFT) 50 MG tablet Take 50 mg by mouth every morning.      . simvastatin (ZOCOR) 40 MG tablet Take 40 mg by mouth every morning.      Marland Kitchen acyclovir (ZOVIRAX) 400 MG tablet Take 400 mg by mouth 5 (five) times daily as needed. For 1-2 days for cold sores        History  Substance Use Topics  . Smoking status: Never Smoker   . Smokeless tobacco: Never Used  . Alcohol Use: No    Family History  Problem Relation Age of Onset  . Heart disease Mother   . Alzheimer's disease Mother   . Hypertension Mother   . Cancer Mother   . Heart attack Father   . Gout Father   .  Hypertension Father      Review of Systems  Constitutional: Negative.   Eyes: Negative.   Respiratory: Negative.   Cardiovascular: Negative.   Gastrointestinal: Negative.   Genitourinary: Negative.   Musculoskeletal: Positive for joint pain.       Left knee pain  Skin: Negative.   Neurological: Negative.   Endo/Heme/Allergies: Negative.   Psychiatric/Behavioral: Negative.     Objective:  Physical Exam  Constitutional: She is oriented to person, place, and time. She appears well-developed and well-nourished.  HENT:  Head: Normocephalic and atraumatic.  Mouth/Throat: Oropharynx is clear and moist.  Eyes: EOM are normal. Pupils are equal, round, and reactive to light.  Neck: Neck supple.  Cardiovascular: Normal rate, regular rhythm and normal heart sounds.   Respiratory: Effort normal and breath sounds normal.  GI: Soft. Bowel sounds are normal.  Genitourinary:       Not pertinent to current symptomatology therefore not examined.  Musculoskeletal: She exhibits edema and tenderness.       Examination of her left knee reveals pain medially and laterally.  1+ effusion.  Range of motion from -5 to 120 degrees.  Knee is stable with normal patella tracking.  Examination of her right knee reveals full range of motion without pain, swelling, weakness or instability.  Vascular exam: Pulses are 2+ and symmetric.    Neurological: She is alert and oriented to person, place, and time.  Skin: Skin is warm and dry.  Psychiatric: She has a normal mood and affect. Her behavior is normal. Judgment and thought content normal.    Vital signs in last 24 hours: Last recorded: 11/06 1300   BP: 142/78 Pulse: 81  Temp: 98.3 F (36.8 C)    Height: 5\' 5"  (1.651 m) SpO2: 94  Weight: 102.059 kg (225 lb)     Labs:   Estimated Body mass index is 37.44 kg/(m^2) as calculated from the following:   Height as of this encounter: 5\' 5" (1.651 m).   Weight as of this encounter: 225 lb(102.059  kg).   Imaging Review Plain radiographs demonstrate severe degenerative joint disease of the left knee(s). The overall alignment issignificant valgus. The bone quality appears to be good for age and reported activity level.  Assessment/Plan:  End stage arthritis, left knee   The patient history, physical examination, clinical judgment of the provider and imaging studies are consistent with end stage degenerative joint disease of the left knee(s) and total knee arthroplasty is deemed medically necessary. The treatment options including medical management, injection therapy arthroscopy and arthroplasty were discussed at length. The risks and benefits of total knee arthroplasty were presented and reviewed. The risks due to aseptic loosening,  infection, stiffness, patella tracking problems, thromboembolic complications and other imponderables were discussed. The patient acknowledged the explanation, agreed to proceed with the plan and consent was signed. Patient is being admitted for inpatient treatment for surgery, pain control, PT, OT, prophylactic antibiotics, VTE prophylaxis, progressive ambulation and ADL's and discharge planning. The patient is planning to be discharged home with home health services

## 2012-04-20 ENCOUNTER — Encounter (HOSPITAL_COMMUNITY)
Admission: RE | Admit: 2012-04-20 | Discharge: 2012-04-20 | Disposition: A | Discharge status: Home / Self Care | Payer: Medicare Other | Source: Ambulatory Visit | Attending: Physician Assistant | Admitting: Physician Assistant

## 2012-04-20 ENCOUNTER — Encounter (HOSPITAL_COMMUNITY)
Admission: RE | Admit: 2012-04-20 | Discharge: 2012-04-20 | Disposition: A | Discharge status: Home / Self Care | Payer: Medicare Other | Source: Ambulatory Visit | Attending: Orthopedic Surgery | Admitting: Orthopedic Surgery

## 2012-04-20 ENCOUNTER — Encounter (HOSPITAL_COMMUNITY): Payer: Self-pay

## 2012-04-20 DIAGNOSIS — C8589 Other specified types of non-Hodgkin lymphoma, extranodal and solid organ sites: Secondary | ICD-10-CM | POA: Diagnosis not present

## 2012-04-20 DIAGNOSIS — Z01818 Encounter for other preprocedural examination: Secondary | ICD-10-CM | POA: Diagnosis not present

## 2012-04-20 DIAGNOSIS — M1710 Unilateral primary osteoarthritis, unspecified knee: Secondary | ICD-10-CM | POA: Diagnosis not present

## 2012-04-20 DIAGNOSIS — I10 Essential (primary) hypertension: Secondary | ICD-10-CM | POA: Diagnosis not present

## 2012-04-20 DIAGNOSIS — N390 Urinary tract infection, site not specified: Secondary | ICD-10-CM | POA: Diagnosis not present

## 2012-04-20 HISTORY — DX: Bronchitis, not specified as acute or chronic: J40

## 2012-04-20 HISTORY — DX: Depression, unspecified: F32.A

## 2012-04-20 HISTORY — DX: Major depressive disorder, single episode, unspecified: F32.9

## 2012-04-20 HISTORY — DX: Migraine, unspecified, not intractable, without status migrainosus: G43.909

## 2012-04-20 HISTORY — DX: Pneumonia, unspecified organism: J18.9

## 2012-04-20 LAB — COMPREHENSIVE METABOLIC PANEL
Alkaline Phosphatase: 111 U/L (ref 39–117)
BUN: 16 mg/dL (ref 6–23)
CO2: 33 mEq/L — ABNORMAL HIGH (ref 19–32)
Chloride: 97 mEq/L (ref 96–112)
Creatinine, Ser: 1.04 mg/dL (ref 0.50–1.10)
GFR calc Af Amer: 63 mL/min — ABNORMAL LOW (ref >90)
GFR calc non Af Amer: 54 mL/min — ABNORMAL LOW (ref >90)
Glucose, Bld: 98 mg/dL (ref 70–99)
Total Bilirubin: 0.6 mg/dL (ref 0.3–1.2)

## 2012-04-20 LAB — CBC WITH DIFFERENTIAL/PLATELET
Basophils Relative: 0 % (ref 0–1)
HCT: 42.1 % (ref 36.0–46.0)
Hemoglobin: 14.4 g/dL (ref 12.0–15.0)
Lymphocytes Relative: 36 % (ref 12–46)
Lymphs Abs: 2.7 10*3/uL (ref 0.7–4.0)
MCHC: 34.2 g/dL (ref 30.0–36.0)
Monocytes Absolute: 0.4 10*3/uL (ref 0.1–1.0)
Monocytes Relative: 6 % (ref 3–12)
Neutro Abs: 4.1 10*3/uL (ref 1.7–7.7)
RBC: 4.75 MIL/uL (ref 3.87–5.11)

## 2012-04-20 LAB — URINALYSIS, ROUTINE W REFLEX MICROSCOPIC
Bilirubin Urine: NEGATIVE
Glucose, UA: NEGATIVE mg/dL
Ketones, ur: NEGATIVE mg/dL
Specific Gravity, Urine: 1.026 (ref 1.005–1.030)
pH: 5 (ref 5.0–8.0)

## 2012-04-20 LAB — URINE MICROSCOPIC-ADD ON

## 2012-04-20 LAB — APTT: aPTT: 34 seconds (ref 24–37)

## 2012-04-20 LAB — PROTIME-INR
INR: 0.99 (ref 0.00–1.49)
Prothrombin Time: 13 seconds (ref 11.6–15.2)

## 2012-04-20 LAB — SURGICAL PCR SCREEN: Staphylococcus aureus: NEGATIVE

## 2012-04-20 NOTE — Progress Notes (Signed)
Patient denied having a stress test or cardiac cath. Patient has sleep apnea and wears a CPAP machine but is unaware of settings.

## 2012-04-21 LAB — URINE CULTURE: Culture: NO GROWTH

## 2012-04-22 ENCOUNTER — Telehealth: Payer: Self-pay | Admitting: Physician Assistant

## 2012-04-22 NOTE — Telephone Encounter (Signed)
I have spoken to this patient.  She has just started oxybutin for her bladder.  This is a potassium wasting medication.  I have started her on Kdur 20  BID today and tomorrow and Q day Saturday and Sunday.  She will have an ISTAT done the day of surgery. Martia Dalby A. Gwinda Passe Physician Assistant [facility] [phone]  04/22/2012, 1:14 PM

## 2012-04-22 NOTE — Consult Note (Signed)
Anesthesia Chart Review:  Patient is a 66 year old female scheduled for a left TKA on 04/26/12 by Dr. Thurston Hole.  History includes non-smoker, obesity, OSA on CPAP, GERD, migraines, HTN, HLD, Non-Hodgkin lymphoma (followed by Dr. Darreld Mclean at [facility]), depression, right thyroid nodule.  She is s/p cystoscopy with intraurethral implantation of Macroplastique on 10/28/11.  PCP is listed as Dr. Selinda Flavin.  EKG on 04/20/12 showed NSR.  CXR on 04/20/12 showed no active cardiopulmonary disease.  Labs noted.  K+ 2.9.  (She is on HCTZ which may be contributing.)  Cr 1.04.  CBC, PT/PTT WNL.  Urine culture showed no growth.  K+ results called to Sherrie at Dr. Sherene Sires office.  Patient will need an ISTAT4 on arrival to re-evaluate hypokalemia.  Shonna Chock, PA-C

## 2012-04-23 NOTE — Progress Notes (Signed)
Left voicemail on answering machine notifying patient of surgery time change to 0530 for Monday surgery.

## 2012-04-25 MED ORDER — LACTATED RINGERS IV SOLN: INTRAVENOUS | Status: DC

## 2012-04-25 MED ORDER — CHLORHEXIDINE GLUCONATE 4 % EX LIQD: 60.0000 mL | Freq: Once | CUTANEOUS | Status: DC

## 2012-04-25 MED ORDER — CEFAZOLIN SODIUM-DEXTROSE 2-3 GM-% IV SOLR
2.0000 g | INTRAVENOUS | Status: AC
  Administered 2012-04-26: 2 g via INTRAVENOUS
  Filled 2012-04-25: qty 50

## 2012-04-25 MED ORDER — POVIDONE-IODINE 7.5 % EX SOLN
Freq: Once | CUTANEOUS | Status: DC
  Filled 2012-04-25: qty 118

## 2012-04-26 ENCOUNTER — Encounter (HOSPITAL_COMMUNITY): Payer: Self-pay | Admitting: Vascular Surgery

## 2012-04-26 ENCOUNTER — Encounter (HOSPITAL_COMMUNITY): Payer: Self-pay | Admitting: Surgery

## 2012-04-26 ENCOUNTER — Encounter (HOSPITAL_COMMUNITY)
Admission: RE | Disposition: A | Discharge status: Home Health | Payer: Self-pay | Source: Ambulatory Visit | Attending: Orthopedic Surgery

## 2012-04-26 ENCOUNTER — Inpatient Hospital Stay (HOSPITAL_COMMUNITY): Payer: Medicare Other | Admitting: Vascular Surgery

## 2012-04-26 ENCOUNTER — Inpatient Hospital Stay (HOSPITAL_COMMUNITY)
Admission: RE | Admit: 2012-04-26 | Discharge: 2012-04-27 | DRG: 470 | Disposition: A | Discharge status: Home Health | Payer: Medicare Other | Source: Ambulatory Visit | Attending: Orthopedic Surgery | Admitting: Orthopedic Surgery

## 2012-04-26 DIAGNOSIS — G4700 Insomnia, unspecified: Secondary | ICD-10-CM | POA: Diagnosis present

## 2012-04-26 DIAGNOSIS — R32 Unspecified urinary incontinence: Secondary | ICD-10-CM | POA: Diagnosis present

## 2012-04-26 DIAGNOSIS — N390 Urinary tract infection, site not specified: Secondary | ICD-10-CM | POA: Diagnosis not present

## 2012-04-26 DIAGNOSIS — G4733 Obstructive sleep apnea (adult) (pediatric): Secondary | ICD-10-CM | POA: Diagnosis present

## 2012-04-26 DIAGNOSIS — E041 Nontoxic single thyroid nodule: Secondary | ICD-10-CM | POA: Diagnosis present

## 2012-04-26 DIAGNOSIS — M1710 Unilateral primary osteoarthritis, unspecified knee: Principal | ICD-10-CM | POA: Diagnosis present

## 2012-04-26 DIAGNOSIS — I10 Essential (primary) hypertension: Secondary | ICD-10-CM | POA: Diagnosis not present

## 2012-04-26 DIAGNOSIS — Z01812 Encounter for preprocedural laboratory examination: Secondary | ICD-10-CM

## 2012-04-26 DIAGNOSIS — M1712 Unilateral primary osteoarthritis, left knee: Secondary | ICD-10-CM | POA: Diagnosis present

## 2012-04-26 DIAGNOSIS — E785 Hyperlipidemia, unspecified: Secondary | ICD-10-CM | POA: Diagnosis present

## 2012-04-26 DIAGNOSIS — N3642 Intrinsic sphincter deficiency (ISD): Secondary | ICD-10-CM | POA: Diagnosis present

## 2012-04-26 DIAGNOSIS — G8918 Other acute postprocedural pain: Secondary | ICD-10-CM | POA: Diagnosis not present

## 2012-04-26 DIAGNOSIS — K219 Gastro-esophageal reflux disease without esophagitis: Secondary | ICD-10-CM | POA: Diagnosis present

## 2012-04-26 DIAGNOSIS — C8590 Non-Hodgkin lymphoma, unspecified, unspecified site: Secondary | ICD-10-CM | POA: Diagnosis present

## 2012-04-26 DIAGNOSIS — C8589 Other specified types of non-Hodgkin lymphoma, extranodal and solid organ sites: Secondary | ICD-10-CM | POA: Diagnosis not present

## 2012-04-26 DIAGNOSIS — M1990 Unspecified osteoarthritis, unspecified site: Secondary | ICD-10-CM | POA: Diagnosis present

## 2012-04-26 HISTORY — PX: TOTAL KNEE ARTHROPLASTY: SHX125

## 2012-04-26 LAB — POCT I-STAT 4, (NA,K, GLUC, HGB,HCT)
Glucose, Bld: 115 mg/dL — ABNORMAL HIGH (ref 70–99)
Potassium: 3.2 mEq/L — ABNORMAL LOW (ref 3.5–5.1)
Sodium: 141 mEq/L (ref 135–145)

## 2012-04-26 SURGERY — ARTHROPLASTY, KNEE, TOTAL
Anesthesia: General | Site: Knee | Laterality: Left | Wound class: Clean

## 2012-04-26 MED ORDER — FENTANYL CITRATE 0.05 MG/ML IJ SOLN
INTRAMUSCULAR | Status: DC | PRN
  Administered 2012-04-26: 50 ug via INTRAVENOUS
  Administered 2012-04-26 (×3): 100 ug via INTRAVENOUS
  Administered 2012-04-26: 50 ug via INTRAVENOUS
  Administered 2012-04-26: 100 ug via INTRAVENOUS

## 2012-04-26 MED ORDER — OXYCODONE HCL 5 MG PO TABS
5.0000 mg | ORAL_TABLET | ORAL | Status: DC | PRN
  Administered 2012-04-26: 10 mg via ORAL
  Administered 2012-04-26: 5 mg via ORAL
  Administered 2012-04-26 – 2012-04-27 (×2): 10 mg via ORAL
  Filled 2012-04-26 (×5): qty 2

## 2012-04-26 MED ORDER — ACETAMINOPHEN 325 MG PO TABS
650.0000 mg | ORAL_TABLET | Freq: Four times a day (QID) | ORAL | Status: DC | PRN
  Administered 2012-04-27: 650 mg via ORAL
  Filled 2012-04-26: qty 2

## 2012-04-26 MED ORDER — ACYCLOVIR 400 MG PO TABS
400.0000 mg | ORAL_TABLET | Freq: Every day | ORAL | Status: DC | PRN
  Filled 2012-04-26: qty 1

## 2012-04-26 MED ORDER — SIMVASTATIN 40 MG PO TABS: 40.0000 mg | ORAL_TABLET | Freq: Every morning | ORAL | Status: DC

## 2012-04-26 MED ORDER — CALCIUM CARBONATE-VITAMIN D 500-200 MG-UNIT PO TABS
1.0000 | ORAL_TABLET | Freq: Every day | ORAL | Status: DC
  Administered 2012-04-26 – 2012-04-27 (×2): 1 via ORAL
  Filled 2012-04-26 (×2): qty 1

## 2012-04-26 MED ORDER — ATORVASTATIN CALCIUM 20 MG PO TABS
20.0000 mg | ORAL_TABLET | Freq: Every day | ORAL | Status: DC
  Administered 2012-04-26: 20 mg via ORAL
  Filled 2012-04-26 (×2): qty 1

## 2012-04-26 MED ORDER — BUPIVACAINE-EPINEPHRINE PF 0.25-1:200000 % IJ SOLN
INTRAMUSCULAR | Status: AC
  Filled 2012-04-26: qty 30

## 2012-04-26 MED ORDER — HYDROMORPHONE HCL PF 1 MG/ML IJ SOLN: 0.2500 mg | INTRAMUSCULAR | Status: DC | PRN

## 2012-04-26 MED ORDER — DIPHENHYDRAMINE HCL 12.5 MG/5ML PO ELIX: 12.5000 mg | ORAL_SOLUTION | ORAL | Status: DC | PRN

## 2012-04-26 MED ORDER — HYDROMORPHONE HCL PF 1 MG/ML IJ SOLN
0.5000 mg | INTRAMUSCULAR | Status: DC | PRN
  Administered 2012-04-26 (×2): 1 mg via INTRAVENOUS
  Filled 2012-04-26 (×2): qty 1

## 2012-04-26 MED ORDER — CEFUROXIME SODIUM 1.5 G IJ SOLR
INTRAMUSCULAR | Status: AC
  Filled 2012-04-26: qty 1.5

## 2012-04-26 MED ORDER — ONDANSETRON HCL 4 MG PO TABS: 4.0000 mg | ORAL_TABLET | Freq: Four times a day (QID) | ORAL | Status: DC | PRN

## 2012-04-26 MED ORDER — DEXAMETHASONE 4 MG PO TABS
10.0000 mg | ORAL_TABLET | Freq: Every day | ORAL | Status: DC
  Administered 2012-04-27: 10 mg via ORAL
  Filled 2012-04-26: qty 1

## 2012-04-26 MED ORDER — HYDROMORPHONE HCL PF 1 MG/ML IJ SOLN
INTRAMUSCULAR | Status: DC | PRN
  Administered 2012-04-26: 1 mg via INTRAVENOUS

## 2012-04-26 MED ORDER — DOCUSATE SODIUM 100 MG PO CAPS
100.0000 mg | ORAL_CAPSULE | Freq: Two times a day (BID) | ORAL | Status: DC
  Administered 2012-04-26 – 2012-04-27 (×3): 100 mg via ORAL
  Filled 2012-04-26 (×4): qty 1

## 2012-04-26 MED ORDER — SERTRALINE HCL 50 MG PO TABS
50.0000 mg | ORAL_TABLET | Freq: Every morning | ORAL | Status: DC
  Administered 2012-04-27: 50 mg via ORAL
  Filled 2012-04-26: qty 1

## 2012-04-26 MED ORDER — POTASSIUM CHLORIDE CRYS ER 20 MEQ PO TBCR
20.0000 meq | EXTENDED_RELEASE_TABLET | Freq: Two times a day (BID) | ORAL | Status: DC
  Administered 2012-04-26 – 2012-04-27 (×3): 20 meq via ORAL
  Filled 2012-04-26 (×4): qty 1

## 2012-04-26 MED ORDER — PROPOFOL 10 MG/ML IV BOLUS
INTRAVENOUS | Status: DC | PRN
  Administered 2012-04-26: 100 mg via INTRAVENOUS
  Administered 2012-04-26: 200 mg via INTRAVENOUS

## 2012-04-26 MED ORDER — DILTIAZEM HCL ER BEADS 300 MG PO CP24
300.0000 mg | ORAL_CAPSULE | Freq: Every morning | ORAL | Status: DC
  Administered 2012-04-27: 300 mg via ORAL
  Filled 2012-04-26: qty 1

## 2012-04-26 MED ORDER — ACETAMINOPHEN 10 MG/ML IV SOLN
1000.0000 mg | Freq: Four times a day (QID) | INTRAVENOUS | Status: AC
  Administered 2012-04-26 – 2012-04-27 (×3): 1000 mg via INTRAVENOUS
  Filled 2012-04-26 (×5): qty 100

## 2012-04-26 MED ORDER — MENTHOL 3 MG MT LOZG: 1.0000 | LOZENGE | OROMUCOSAL | Status: DC | PRN

## 2012-04-26 MED ORDER — PANTOPRAZOLE SODIUM 40 MG PO TBEC: 40.0000 mg | DELAYED_RELEASE_TABLET | Freq: Every day | ORAL | Status: DC

## 2012-04-26 MED ORDER — CEFUROXIME SODIUM 1.5 G IJ SOLR
INTRAMUSCULAR | Status: DC | PRN
  Administered 2012-04-26: 1.5 g

## 2012-04-26 MED ORDER — ACETAMINOPHEN 650 MG RE SUPP: 650.0000 mg | Freq: Four times a day (QID) | RECTAL | Status: DC | PRN

## 2012-04-26 MED ORDER — LIDOCAINE HCL (CARDIAC) 20 MG/ML IV SOLN
INTRAVENOUS | Status: DC | PRN
  Administered 2012-04-26 (×6): 50 mg via INTRAVENOUS
  Administered 2012-04-26: 100 mg via INTRAVENOUS
  Administered 2012-04-26: 50 mg via INTRAVENOUS

## 2012-04-26 MED ORDER — ONDANSETRON HCL 4 MG/2ML IJ SOLN
INTRAMUSCULAR | Status: DC | PRN
  Administered 2012-04-26: 4 mg via INTRAVENOUS

## 2012-04-26 MED ORDER — METOCLOPRAMIDE HCL 10 MG PO TABS: 5.0000 mg | ORAL_TABLET | Freq: Three times a day (TID) | ORAL | Status: DC | PRN

## 2012-04-26 MED ORDER — LACTATED RINGERS IV SOLN
INTRAVENOUS | Status: DC | PRN
  Administered 2012-04-26: 07:00:00 via INTRAVENOUS

## 2012-04-26 MED ORDER — DEXAMETHASONE SODIUM PHOSPHATE 10 MG/ML IJ SOLN
10.0000 mg | Freq: Every day | INTRAMUSCULAR | Status: DC
  Administered 2012-04-26: 10 mg via INTRAVENOUS
  Filled 2012-04-26: qty 1

## 2012-04-26 MED ORDER — BUPIVACAINE-EPINEPHRINE 0.25% -1:200000 IJ SOLN
INTRAMUSCULAR | Status: DC | PRN
  Administered 2012-04-26: 30 mL

## 2012-04-26 MED ORDER — PHENOL 1.4 % MT LIQD: 1.0000 | OROMUCOSAL | Status: DC | PRN

## 2012-04-26 MED ORDER — OXYBUTYNIN CHLORIDE ER 10 MG PO TB24
10.0000 mg | ORAL_TABLET | Freq: Every morning | ORAL | Status: DC
  Administered 2012-04-27: 10 mg via ORAL
  Filled 2012-04-26: qty 1

## 2012-04-26 MED ORDER — ENOXAPARIN SODIUM 30 MG/0.3ML ~~LOC~~ SOLN
30.0000 mg | Freq: Two times a day (BID) | SUBCUTANEOUS | Status: DC
  Administered 2012-04-27: 30 mg via SUBCUTANEOUS
  Filled 2012-04-26 (×3): qty 0.3

## 2012-04-26 MED ORDER — METOCLOPRAMIDE HCL 5 MG/ML IJ SOLN: 5.0000 mg | Freq: Three times a day (TID) | INTRAMUSCULAR | Status: DC | PRN

## 2012-04-26 MED ORDER — POTASSIUM CHLORIDE IN NACL 20-0.9 MEQ/L-% IV SOLN
INTRAVENOUS | Status: DC
  Administered 2012-04-26: 14:00:00 via INTRAVENOUS
  Filled 2012-04-26 (×5): qty 1000

## 2012-04-26 MED ORDER — ARTIFICIAL TEARS OP OINT
TOPICAL_OINTMENT | OPHTHALMIC | Status: DC | PRN
  Administered 2012-04-26: 1 via OPHTHALMIC

## 2012-04-26 MED ORDER — BISACODYL 5 MG PO TBEC
10.0000 mg | DELAYED_RELEASE_TABLET | Freq: Every day | ORAL | Status: DC
  Administered 2012-04-26: 10 mg via ORAL

## 2012-04-26 MED ORDER — ONDANSETRON HCL 4 MG/2ML IJ SOLN: 4.0000 mg | Freq: Four times a day (QID) | INTRAMUSCULAR | Status: DC | PRN

## 2012-04-26 MED ORDER — SODIUM CHLORIDE 0.9 % IR SOLN
Status: DC | PRN
  Administered 2012-04-26: 3000 mL

## 2012-04-26 MED ORDER — ZOLPIDEM TARTRATE 5 MG PO TABS: 5.0000 mg | ORAL_TABLET | Freq: Every evening | ORAL | Status: DC | PRN

## 2012-04-26 MED ORDER — MIDAZOLAM HCL 5 MG/5ML IJ SOLN
INTRAMUSCULAR | Status: DC | PRN
  Administered 2012-04-26: 2 mg via INTRAVENOUS

## 2012-04-26 MED ORDER — CALCIUM CARB-CHOLECALCIFEROL 600-800 MG-UNIT PO TABS: 1.0000 | ORAL_TABLET | Freq: Every morning | ORAL | Status: DC

## 2012-04-26 MED ORDER — DEXAMETHASONE SODIUM PHOSPHATE 10 MG/ML IJ SOLN
INTRAMUSCULAR | Status: AC
  Filled 2012-04-26: qty 1

## 2012-04-26 MED ORDER — CELECOXIB 200 MG PO CAPS
200.0000 mg | ORAL_CAPSULE | Freq: Two times a day (BID) | ORAL | Status: DC
  Administered 2012-04-26 – 2012-04-27 (×3): 200 mg via ORAL
  Filled 2012-04-26 (×4): qty 1

## 2012-04-26 SURGICAL SUPPLY — 72 items
BANDAGE ELASTIC 4 VELCRO ST LF (GAUZE/BANDAGES/DRESSINGS) ×1 IMPLANT
BANDAGE ELASTIC 6 VELCRO ST LF (GAUZE/BANDAGES/DRESSINGS) ×1 IMPLANT
BANDAGE ESMARK 6X9 LF (GAUZE/BANDAGES/DRESSINGS) ×1 IMPLANT
BLADE SAGITTAL 25.0X1.19X90 (BLADE) ×2 IMPLANT
BLADE SAW SGTL 11.0X1.19X90.0M (BLADE) IMPLANT
BLADE SAW SGTL 13.0X1.19X90.0M (BLADE) ×2 IMPLANT
BLADE SURG 10 STRL SS (BLADE) ×4 IMPLANT
BNDG CMPR 9X6 STRL LF SNTH (GAUZE/BANDAGES/DRESSINGS) ×1
BNDG CMPR MED 15X6 ELC VLCR LF (GAUZE/BANDAGES/DRESSINGS) ×1
BNDG ELASTIC 6X15 VLCR STRL LF (GAUZE/BANDAGES/DRESSINGS) ×2 IMPLANT
BNDG ESMARK 6X9 LF (GAUZE/BANDAGES/DRESSINGS) ×2
BOWL SMART MIX CTS (DISPOSABLE) ×2 IMPLANT
CEMENT HV SMART SET (Cement) ×3 IMPLANT
CLOTH BEACON ORANGE TIMEOUT ST (SAFETY) ×2 IMPLANT
CLSR STERI-STRIP ANTIMIC 1/2X4 (GAUZE/BANDAGES/DRESSINGS) ×1 IMPLANT
COVER BACK TABLE 24X17X13 BIG (DRAPES) IMPLANT
COVER PROBE W GEL 5X96 (DRAPES) ×2 IMPLANT
COVER SURGICAL LIGHT HANDLE (MISCELLANEOUS) ×2 IMPLANT
CUFF TOURNIQUET SINGLE 34IN LL (TOURNIQUET CUFF) ×2 IMPLANT
CUFF TOURNIQUET SINGLE 44IN (TOURNIQUET CUFF) IMPLANT
DRAPE EXTREMITY T 121X128X90 (DRAPE) ×2 IMPLANT
DRAPE INCISE IOBAN 66X45 STRL (DRAPES) ×2 IMPLANT
DRAPE PROXIMA HALF (DRAPES) ×2 IMPLANT
DRAPE U-SHAPE 47X51 STRL (DRAPES) ×2 IMPLANT
DRSG ADAPTIC 3X8 NADH LF (GAUZE/BANDAGES/DRESSINGS) ×2 IMPLANT
DRSG PAD ABDOMINAL 8X10 ST (GAUZE/BANDAGES/DRESSINGS) ×3 IMPLANT
DURAPREP 26ML APPLICATOR (WOUND CARE) ×2 IMPLANT
ELECT CAUTERY BLADE 6.4 (BLADE) ×2 IMPLANT
ELECT REM PT RETURN 9FT ADLT (ELECTROSURGICAL) ×2
ELECTRODE REM PT RTRN 9FT ADLT (ELECTROSURGICAL) ×1 IMPLANT
EVACUATOR 1/8 PVC DRAIN (DRAIN) ×1 IMPLANT
FACESHIELD LNG OPTICON STERILE (SAFETY) ×2 IMPLANT
GLOVE BIO SURGEON STRL SZ7 (GLOVE) ×4 IMPLANT
GLOVE BIOGEL PI IND STRL 7.0 (GLOVE) ×2 IMPLANT
GLOVE BIOGEL PI IND STRL 7.5 (GLOVE) ×1 IMPLANT
GLOVE BIOGEL PI INDICATOR 7.0 (GLOVE) ×2
GLOVE BIOGEL PI INDICATOR 7.5 (GLOVE) ×2
GLOVE SS BIOGEL STRL SZ 7.5 (GLOVE) ×1 IMPLANT
GLOVE SUPERSENSE BIOGEL SZ 7.5 (GLOVE) ×1
GLOVE SURG SS PI 6.5 STRL IVOR (GLOVE) ×1 IMPLANT
GOWN PREVENTION PLUS XLARGE (GOWN DISPOSABLE) ×4 IMPLANT
GOWN STRL NON-REIN LRG LVL3 (GOWN DISPOSABLE) ×4 IMPLANT
HANDPIECE INTERPULSE COAX TIP (DISPOSABLE) ×2
HOOD PEEL AWAY FACE SHEILD DIS (HOOD) ×6 IMPLANT
IMMOBILIZER KNEE 22 UNIV (SOFTGOODS) ×2 IMPLANT
INSERT CUSHION PRONEVIEW LG (MISCELLANEOUS) ×2 IMPLANT
KIT BASIN OR (CUSTOM PROCEDURE TRAY) ×2 IMPLANT
KIT ROOM TURNOVER OR (KITS) ×2 IMPLANT
MANIFOLD NEPTUNE II (INSTRUMENTS) ×2 IMPLANT
NS IRRIG 1000ML POUR BTL (IV SOLUTION) ×2 IMPLANT
PACK TOTAL JOINT (CUSTOM PROCEDURE TRAY) ×2 IMPLANT
PAD ARMBOARD 7.5X6 YLW CONV (MISCELLANEOUS) ×4 IMPLANT
PAD CAST 4YDX4 CTTN HI CHSV (CAST SUPPLIES) ×1 IMPLANT
PADDING CAST COTTON 4X4 STRL (CAST SUPPLIES) ×2
PADDING CAST COTTON 6X4 STRL (CAST SUPPLIES) ×2 IMPLANT
POSITIONER HEAD PRONE TRACH (MISCELLANEOUS) ×2 IMPLANT
RUBBERBAND STERILE (MISCELLANEOUS) ×2 IMPLANT
SET HNDPC FAN SPRY TIP SCT (DISPOSABLE) ×1 IMPLANT
SPONGE GAUZE 4X4 12PLY (GAUZE/BANDAGES/DRESSINGS) ×2 IMPLANT
STRIP CLOSURE SKIN 1/2X4 (GAUZE/BANDAGES/DRESSINGS) ×2 IMPLANT
SUCTION FRAZIER TIP 10 FR DISP (SUCTIONS) ×2 IMPLANT
SUT ETHIBOND NAB CT1 #1 30IN (SUTURE) ×4 IMPLANT
SUT MNCRL AB 3-0 PS2 18 (SUTURE) ×2 IMPLANT
SUT VIC AB 0 CT1 27 (SUTURE) ×6
SUT VIC AB 0 CT1 27XBRD ANBCTR (SUTURE) ×2 IMPLANT
SUT VIC AB 2-0 CT1 27 (SUTURE) ×4
SUT VIC AB 2-0 CT1 TAPERPNT 27 (SUTURE) ×2 IMPLANT
SYR 30ML SLIP (SYRINGE) ×2 IMPLANT
TOWEL OR 17X24 6PK STRL BLUE (TOWEL DISPOSABLE) ×2 IMPLANT
TOWEL OR 17X26 10 PK STRL BLUE (TOWEL DISPOSABLE) ×2 IMPLANT
TRAY FOLEY CATH 14FR (SET/KITS/TRAYS/PACK) ×2 IMPLANT
WATER STERILE IRR 1000ML POUR (IV SOLUTION) ×6 IMPLANT

## 2012-04-26 NOTE — Anesthesia Preprocedure Evaluation (Addendum)
Anesthesia Evaluation  Patient identified by MRN, date of birth, ID band Patient awake    Reviewed: Allergy & Precautions, H&P , NPO status , Patient's Chart, lab work & pertinent test results  Airway Mallampati: II TM Distance: >3 FB Neck ROM: Full    Dental  (+) Teeth Intact and Dental Advisory Given   Pulmonary sleep apnea and Continuous Positive Airway Pressure Ventilation , pneumonia -,  breath sounds clear to auscultation        Cardiovascular hypertension, Pt. on medications Rhythm:Regular Rate:Normal     Neuro/Psych    GI/Hepatic Neg liver ROS, GERD-  Medicated and Controlled,  Endo/Other  Morbid obesity  Renal/GU negative Renal ROS     Musculoskeletal   Abdominal   Peds  Hematology   Anesthesia Other Findings   Reproductive/Obstetrics                        Anesthesia Physical Anesthesia Plan  ASA: III  Anesthesia Plan: General   Post-op Pain Management:    Induction: Intravenous  Airway Management Planned: LMA  Additional Equipment:   Intra-op Plan:   Post-operative Plan: Extubation in OR  Informed Consent: I have reviewed the patients History and Physical, chart, labs and discussed the procedure including the risks, benefits and alternatives for the proposed anesthesia with the patient or authorized representative who has indicated his/her understanding and acceptance.   Dental advisory given  Plan Discussed with: CRNA, Anesthesiologist and Surgeon  Anesthesia Plan Comments:        Anesthesia Quick Evaluation

## 2012-04-26 NOTE — Progress Notes (Deleted)
Physical Therapy Evaluation Patient Details Name: Hailey [name] MRN: [id number] DOB: 08/02/1945 Today's Date: 04/26/2012 Time: 4696-2952 PT Time Calculation (min): 15 min  PT Assessment / Plan / Recommendation Clinical Impression  Pt is 66 yo female s/p right TKA who tolerated treatment well POD 0. Pt stood EOB x2 mins and was tolerating CPM well.  Pt has discomfort with extension, left with heel propped. Recommend HHPT after d/c, pt already has equipment, PT will follow.    PT Assessment  Patient needs continued PT services    Follow Up Recommendations  Home health PT;Supervision for mobility/OOB    Does the patient have the potential to tolerate intense rehabilitation      Barriers to Discharge None      Equipment Recommendations  None recommended by PT    Recommendations for Other Services     Frequency 7X/week    Precautions / Restrictions Precautions Precautions: Knee Precaution Comments: no pillow under knee discussed, knees of bed locked out Required Braces or Orthoses: Knee Immobilizer - Right Knee Immobilizer - Right: On when out of bed or walking Restrictions Weight Bearing Restrictions: Yes LLE Weight Bearing: Weight bearing as tolerated   Pertinent Vitals/Pain 7/10 right knee pain, meds requested from RN. Pt also c/o something in her left eye that feels like it is scratching      Mobility  Bed Mobility Bed Mobility: Supine to Sit;Sitting - Scoot to Edge of Bed Supine to Sit: 4: Min assist;With rails Sitting - Scoot to Edge of Bed: 5: Supervision;With rail Details for Bed Mobility Assistance: pt able to get self to EOB with right LE supported Transfers Transfers: Sit to Stand;Stand to Sit Sit to Stand: 3: Mod assist;From bed;With upper extremity assist Stand to Sit: 4: Min assist;To bed;With upper extremity assist Details for Transfer Assistance: pt with sufficient strength LLE to stand, minimal wt put on RLE. Pt maintained standing x2 mins with min  A Ambulation/Gait Ambulation/Gait Assistance: Not tested (comment) Stairs: No Wheelchair Mobility Wheelchair Mobility: No    Shoulder Instructions     Exercises Total Joint Exercises Ankle Circles/Pumps: AROM;Both;10 reps;Supine Quad Sets: Other (comment) (educated family)   PT Diagnosis: Difficulty walking;Acute pain  PT Problem List: Decreased strength;Decreased range of motion;Decreased activity tolerance;Decreased knowledge of use of DME;Decreased knowledge of precautions;Pain;Decreased mobility PT Treatment Interventions: DME instruction;Gait training;Stair training;Functional mobility training;Therapeutic activities;Therapeutic exercise;Patient/family education   PT Goals Acute Rehab PT Goals PT Goal Formulation: With patient/family Time For Goal Achievement: 05/03/12 Potential to Achieve Goals: Good Pt will go Supine/Side to Sit: with min assist PT Goal: Supine/Side to Sit - Progress: Goal set today Pt will go Sit to Supine/Side: with min assist PT Goal: Sit to Supine/Side - Progress: Goal set today Pt will go Sit to Stand: with supervision PT Goal: Sit to Stand - Progress: Goal set today Pt will go Stand to Sit: with supervision PT Goal: Stand to Sit - Progress: Goal set today Pt will Ambulate: 51 - 150 feet;with supervision;with rolling walker PT Goal: Ambulate - Progress: Goal set today Pt will Go Up / Down Stairs: 1-2 stairs;with rolling walker;with min assist PT Goal: Up/Down Stairs - Progress: Goal set today Pt will Perform Home Exercise Program: with supervision, verbal cues required/provided PT Goal: Perform Home Exercise Program - Progress: Goal set today  Visit Information  Last PT Received On: 04/26/12 Assistance Needed: +1    Subjective Data  Subjective: my eye feels scratchy Patient Stated Goal: return home   Prior Functioning  Home Living  Lives With: Spouse Available Help at Discharge: Family;Available 24 hours/day Type of Home: House Home  Access: Stairs to enter [employer] of Steps: 1 Entrance Stairs-Rails: None Home Layout: One level Bathroom Shower/Tub: Health visitor: Handicapped height Bathroom Accessibility: Yes How Accessible: Accessible via walker Home Adaptive Equipment: Walker - rolling;Built-in shower seat Prior Function Level of Independence: Independent Able to Take Stairs?: Yes Vocation: Retired Musician: No difficulties    Cognition  Overall Cognitive Status: Appears within functional limits for tasks assessed/performed Arousal/Alertness: Awake/alert Orientation Level: Appears intact for tasks assessed Behavior During Session: [facility] for tasks performed    Extremity/Trunk Assessment Right Upper Extremity Assessment RUE ROM/Strength/Tone: [facility] for tasks assessed Left Upper Extremity Assessment LUE ROM/Strength/Tone: WFL for tasks assessed Right Lower Extremity Assessment RLE ROM/Strength/Tone: Deficits RLE ROM/Strength/Tone Deficits: hip flex 3-/5, no active knee extension today, ankle WFL Left Lower Extremity Assessment LLE ROM/Strength/Tone: WFL for tasks assessed LLE Sensation: WFL - Light Touch;WFL - Proprioception LLE Coordination: WFL - gross motor Trunk Assessment Trunk Assessment: Normal   Balance Balance Balance Assessed: Yes Static Sitting Balance Static Sitting - Balance Support: No upper extremity supported;Feet supported Static Sitting - Level of Assistance: 6: Modified independent (Device/Increase time)  End of Session PT - End of Session Equipment Utilized During Treatment: Gait belt Activity Tolerance: Patient tolerated treatment well Patient left: in bed;with call bell/phone within reach;with family/visitor present Nurse Communication: Mobility status;Patient requests pain meds CPM Left Knee CPM Left Knee: Off Left Knee Flexion (Degrees): 60  Left Knee Extension (Degrees): 0   GP   Lyanne Co, PT  Acute Rehab Services   [phone]   Lyanne Co 04/26/2012, 4:32 PM

## 2012-04-26 NOTE — H&P (View-Only) (Signed)
TOTAL KNEE ADMISSION H&P  Patient is being admitted for left total knee arthroplasty.  Subjective:  Chief Complaint:left knee pain.  HPI: Hailey [name], 66 y.o. female, has a history of pain and functional disability in the left knee due to arthritis and has failed non-surgical conservative treatments for greater than 12 weeks to includeNSAID's and/or analgesics, corticosteriod injections, flexibility and strengthening excercises, supervised PT with diminished ADL's post treatment, use of assistive devices, weight reduction as appropriate and activity modification.  Onset of symptoms was gradual, starting 7 years ago with gradually worsening course since that time. The patient noted no past surgery on the left knee(s).  Patient currently rates pain in the left knee(s) at 8 out of 10 with activity. Patient has night pain, worsening of pain with activity and weight bearing, pain that interferes with activities of daily living, pain with passive range of motion, crepitus and joint swelling.  Patient has evidence of subchondral sclerosis, periarticular osteophytes and joint space narrowing by imaging studies. There is no active infection.  Patient Active Problem List   Diagnosis Date Noted  . Hypertension   . Hyperlipidemia   . Intrinsic urethral sphincter deficiency   . Non-Hodgkin lymphoma   . Right thyroid nodule   . OSA on CPAP   . Arthritis   . GERD (gastroesophageal reflux disease)   . Incontinence of urine   . Insomnia    Past Medical History  Diagnosis Date  . Hypertension   . Hyperlipidemia   . Intrinsic urethral sphincter deficiency   . Non-Hodgkin lymphoma DX  JULY 2012---  SMALL AND SLOW GROWING--  NO TX      FOLLOWED BY [facility]  . Right thyroid nodule ACCIDENTAL FINDING FROM PET SCAN  2012  . OSA on CPAP   . Arthritis KNEES AND HIPS  . GERD (gastroesophageal reflux disease)   . Frequency of urination   . Urgency of urination   . Incontinence of urine     . Nocturia   . UTI (lower urinary tract infection)   . Insomnia     Past Surgical History  Procedure Date  . Cholecystectomy 1989  . Tonsillectomy childhood  . Dilation and curettage of uterus 2009    hysteroscopy/ablasion  . Anterior and posterior repair 1994  . Bunionectomy JAN  2012  & SEPT 2010  . Left retroperitoneal lymph  node bx 12-25-2010  . Cystoscopy with injection 10/28/2011    Procedure: CYSTOSCOPY WITH INJECTION;  Surgeon: John J Wrenn, MD;  Location: [facility];  Service: Urology;  Laterality: N/A;  MACRO PLASTIQUE     (Not in a hospital admission) No Known Allergies  Current Outpatient Prescriptions on File Prior to Visit  Medication Sig Dispense Refill  . Calcium Carb-Cholecalciferol (CALCIUM 600/VITAMIN D3) 600-800 MG-UNIT TABS Take 1 tablet by mouth every morning.      . diltiazem (TIAZAC) 300 MG 24 hr capsule Take 300 mg by mouth every morning.      . estradiol (ESTRACE) 0.1 MG/GM vaginal cream Place 1 g vaginally 3 (three) times a week.      . hydrochlorothiazide (HYDRODIURIL) 25 MG tablet Take 25 mg by mouth every morning.      . meloxicam (MOBIC) 7.5 MG tablet Take 15 mg by mouth daily.      . omeprazole (PRILOSEC) 20 MG capsule Take 20 mg by mouth every morning.      . oxybutynin (DITROPAN-XL) 10 MG 24 hr tablet Take 10 mg by mouth   every morning.      . sertraline (ZOLOFT) 50 MG tablet Take 50 mg by mouth every morning.      . simvastatin (ZOCOR) 40 MG tablet Take 40 mg by mouth every morning.      . acyclovir (ZOVIRAX) 400 MG tablet Take 400 mg by mouth 5 (five) times daily as needed. For 1-2 days for cold sores        History  Substance Use Topics  . Smoking status: Never Smoker   . Smokeless tobacco: Never Used  . Alcohol Use: No    Family History  Problem Relation Age of Onset  . Heart disease Mother   . Alzheimer's disease Mother   . Hypertension Mother   . Cancer Mother   . Heart attack Father   . Gout Father   .  Hypertension Father      Review of Systems  Constitutional: Negative.   Eyes: Negative.   Respiratory: Negative.   Cardiovascular: Negative.   Gastrointestinal: Negative.   Genitourinary: Negative.   Musculoskeletal: Positive for joint pain.       Left knee pain  Skin: Negative.   Neurological: Negative.   Endo/Heme/Allergies: Negative.   Psychiatric/Behavioral: Negative.     Objective:  Physical Exam  Constitutional: She is oriented to person, place, and time. She appears well-developed and well-nourished.  HENT:  Head: Normocephalic and atraumatic.  Mouth/Throat: Oropharynx is clear and moist.  Eyes: EOM are normal. Pupils are equal, round, and reactive to light.  Neck: Neck supple.  Cardiovascular: Normal rate, regular rhythm and normal heart sounds.   Respiratory: Effort normal and breath sounds normal.  GI: Soft. Bowel sounds are normal.  Genitourinary:       Not pertinent to current symptomatology therefore not examined.  Musculoskeletal: She exhibits edema and tenderness.       Examination of her left knee reveals pain medially and laterally.  1+ effusion.  Range of motion from -5 to 120 degrees.  Knee is stable with normal patella tracking.  Examination of her right knee reveals full range of motion without pain, swelling, weakness or instability.  Vascular exam: Pulses are 2+ and symmetric.    Neurological: She is alert and oriented to person, place, and time.  Skin: Skin is warm and dry.  Psychiatric: She has a normal mood and affect. Her behavior is normal. Judgment and thought content normal.    Vital signs in last 24 hours: Last recorded: 11/06 1300   BP: 142/78 Pulse: 81  Temp: 98.3 F (36.8 C)    Height: 5' 5" (1.651 m) SpO2: 94  Weight: 102.059 kg (225 lb)     Labs:   Estimated Body mass index is 37.44 kg/(m^2) as calculated from the following:   Height as of this encounter: 5' 5"(1.651 m).   Weight as of this encounter: 225 lb(102.059  kg).   Imaging Review Plain radiographs demonstrate severe degenerative joint disease of the left knee(s). The overall alignment issignificant valgus. The bone quality appears to be good for age and reported activity level.  Assessment/Plan:  End stage arthritis, left knee   The patient history, physical examination, clinical judgment of the provider and imaging studies are consistent with end stage degenerative joint disease of the left knee(s) and total knee arthroplasty is deemed medically necessary. The treatment options including medical management, injection therapy arthroscopy and arthroplasty were discussed at length. The risks and benefits of total knee arthroplasty were presented and reviewed. The risks due to aseptic loosening,   infection, stiffness, patella tracking problems, thromboembolic complications and other imponderables were discussed. The patient acknowledged the explanation, agreed to proceed with the plan and consent was signed. Patient is being admitted for inpatient treatment for surgery, pain control, PT, OT, prophylactic antibiotics, VTE prophylaxis, progressive ambulation and ADL's and discharge planning. The patient is planning to be discharged home with home health services   

## 2012-04-26 NOTE — Progress Notes (Signed)
RT Note: Pt refused to wear CPAP tonight. Pt informed that if she changed her mind to let RN know.

## 2012-04-26 NOTE — Progress Notes (Signed)
Physical Therapy Evaluation Patient Details Name: Hailey [name] MRN: [id number] DOB: [birth year] Today's Date: 04/26/2012 Time: 4098-1191 PT Time Calculation (min): 37 min  PT Assessment / Plan / Recommendation Clinical Impression  Pt is 66 yo female s/p left TKA who tolerated treatment well POD 0. Pt stood EOB x2 mins and was tolerating CPM well.  Pt has discomfort with extension, left with heel propped. Recommend HHPT after d/c, pt already has equipment, PT will follow.    PT Assessment  Patient needs continued PT services    Follow Up Recommendations  Home health PT;Supervision for mobility/OOB    Does the patient have the potential to tolerate intense rehabilitation      Barriers to Discharge None      Equipment Recommendations  None recommended by PT    Recommendations for Other Services     Frequency 7X/week    Precautions / Restrictions Precautions Precautions: Knee Precaution Comments: no pillow under knee discussed, knees of bed locked out Required Braces or Orthoses: Knee Immobilizer - Left Knee Immobilizer - Left: On when out of bed or walking Restrictions Weight Bearing Restrictions: Yes LLE Weight Bearing: Weight bearing as tolerated   Pertinent Vitals/Pain 7/10 left knee pain, premedicated      Mobility  Bed Mobility Bed Mobility: Supine to Sit;Sitting - Scoot to Edge of Bed Supine to Sit: 4: Min assist;With rails Sitting - Scoot to Edge of Bed: 5: Supervision;With rail Details for Bed Mobility Assistance: pt able to get self to EOB with right LE supported Transfers Transfers: Sit to Stand;Stand to Sit Sit to Stand: From bed;With upper extremity assist;4: Min assist Stand to Sit: 4: Min assist;To bed;With upper extremity assist Details for Transfer Assistance: pt with sufficient strength RLE to stand, minimal wt put on LLE. Pt maintained standing x2 mins with min A Ambulation/Gait Ambulation/Gait Assistance: Not tested (comment) Assistive device:  Rolling walker Stairs: No Wheelchair Mobility Wheelchair Mobility: No    Shoulder Instructions     Exercises Total Joint Exercises Ankle Circles/Pumps: AROM;Both;10 reps;Supine Quad Sets: Other (comment) (educated family)   PT Diagnosis: Difficulty walking;Acute pain  PT Problem List: Decreased strength;Decreased range of motion;Decreased activity tolerance;Decreased knowledge of use of DME;Decreased knowledge of precautions;Pain;Decreased mobility PT Treatment Interventions: DME instruction;Gait training;Stair training;Functional mobility training;Therapeutic activities;Therapeutic exercise;Patient/family education   PT Goals Acute Rehab PT Goals PT Goal Formulation: With patient/family Time For Goal Achievement: 05/03/12 Potential to Achieve Goals: Good Pt will go Supine/Side to Sit: with min assist PT Goal: Supine/Side to Sit - Progress: Goal set today Pt will go Sit to Supine/Side: with min assist PT Goal: Sit to Supine/Side - Progress: Goal set today Pt will go Sit to Stand: with supervision PT Goal: Sit to Stand - Progress: Goal set today Pt will go Stand to Sit: with supervision PT Goal: Stand to Sit - Progress: Goal set today Pt will Ambulate: 51 - 150 feet;with supervision;with rolling walker PT Goal: Ambulate - Progress: Goal set today Pt will Go Up / Down Stairs: 1-2 stairs;with rolling walker;with min assist PT Goal: Up/Down Stairs - Progress: Goal set today Pt will Perform Home Exercise Program: with supervision, verbal cues required/provided PT Goal: Perform Home Exercise Program - Progress: Goal set today  Visit Information  Last PT Received On: 04/26/12 Assistance Needed: +1    Subjective Data  Subjective: pt felt nauseous with sitting EOB Patient Stated Goal: return home   Prior Functioning  Home Living Lives With: Spouse Available Help at Discharge: Family;Available 24 hours/day Type  of Home: House Home Access: Stairs to enter [employer]  of Steps: 1 Entrance Stairs-Rails: None Home Layout: One level Bathroom Shower/Tub: Engineer, manufacturing systems: Handicapped height Bathroom Accessibility: Yes How Accessible: Accessible via walker Home Adaptive Equipment: Walker - rolling;Shower chair with back;Grab bars in shower Additional Comments: pt's daughters are going to stay with her first few weeks Prior Function Level of Independence: Independent with assistive device(s) (cane) Able to Take Stairs?: Yes Driving: Yes Vocation: Retired Musician: No difficulties    Cognition  Overall Cognitive Status: Appears within functional limits for tasks assessed/performed Arousal/Alertness: Awake/alert Orientation Level: Appears intact for tasks assessed Behavior During Session: [facility] for tasks performed    Extremity/Trunk Assessment Right Upper Extremity Assessment RUE ROM/Strength/Tone: [facility] for tasks assessed Left Upper Extremity Assessment LUE ROM/Strength/Tone: WFL for tasks assessed Right Lower Extremity Assessment RLE ROM/Strength/Tone: WFL for tasks assessed RLE Sensation: WFL - Light Touch;WFL - Proprioception RLE Coordination: WFL - gross motor Left Lower Extremity Assessment LLE ROM/Strength/Tone: Deficits LLE ROM/Strength/Tone Deficits: hip flex 3-/5, knee extension 2/5 Trunk Assessment Trunk Assessment: Normal   Balance Balance Balance Assessed: Yes Static Sitting Balance Static Sitting - Balance Support: No upper extremity supported;Feet supported Static Sitting - Level of Assistance: 6: Modified independent (Device/Increase time)  End of Session PT - End of Session Equipment Utilized During Treatment: Gait belt Activity Tolerance: Patient tolerated treatment well Patient left: in bed;with call bell/phone within reach;with family/visitor present;in CPM Nurse Communication: Mobility status;Patient requests pain meds CPM Left Knee CPM Left Knee: On Left Knee Flexion (Degrees): 60  Left  Knee Extension (Degrees): 0  Additional Comments: pt nauseous with sitting up  GP   Lyanne Co, PT  Acute Rehab Services  [phone]   Lyanne Co 04/26/2012, 4:41 PM

## 2012-04-26 NOTE — Transfer of Care (Signed)
Immediate Anesthesia Transfer of Care Note  Patient: Hailey [name]  Procedure(s) Performed: Procedure(s) (LRB) with comments: TOTAL KNEE ARTHROPLASTY (Left)  Patient Location: PACU  Anesthesia Type:General and Regional  Level of Consciousness: sedated and patient cooperative  Airway & Oxygen Therapy: Patient Spontanous Breathing and Patient connected to nasal cannula oxygen  Post-op Assessment: Report given to PACU RN, Post -op Vital signs reviewed and stable and Patient moving all extremities X 4  Post vital signs: Reviewed and stable  Complications: No apparent anesthesia complications

## 2012-04-26 NOTE — Progress Notes (Signed)
Orthopedic Tech Progress Note Patient Details:  Hailey [name] 11-19-1945 [id number] CPM applied to Left LE with appropriate settings; OHF applied to bed     Hailey [name] 04/26/2012, 10:05 AM

## 2012-04-26 NOTE — Anesthesia Procedure Notes (Signed)
Anesthesia Regional Block:  Femoral nerve block  Pre-Anesthetic Checklist: ,, timeout performed, Correct Patient, Correct Site, Correct Laterality, Correct Procedure, Correct Position, site marked, Risks and benefits discussed,  Surgical consent,  Pre-op evaluation,  At surgeon's request and post-op pain management  Laterality: Left  Prep: chloraprep       Needles:   Needle Type: Echogenic Needle          Additional Needles:  Procedures: Doppler guided and nerve stimulator Femoral nerve block Narrative:  Start time: 04/26/2012 6:55 AM End time: 04/26/2012 7:10 AM Injection made incrementally with aspirations every 5 mL.  Performed by: Personally  Anesthesiologist: Dr. Randa Evens  Femoral nerve block

## 2012-04-26 NOTE — Anesthesia Postprocedure Evaluation (Signed)
  Anesthesia Post-op Note  Patient: Hailey [name]  Procedure(s) Performed: Procedure(s) (LRB) with comments: TOTAL KNEE ARTHROPLASTY (Left)  Patient Location: PACU  Anesthesia Type:General  Level of Consciousness: awake  Airway and Oxygen Therapy: Patient Spontanous Breathing  Post-op Pain: mild  Post-op Assessment: Post-op Vital signs reviewed  Post-op Vital Signs: Reviewed  Complications: No apparent anesthesia complications

## 2012-04-26 NOTE — Progress Notes (Signed)
UR COMPLETED  

## 2012-04-26 NOTE — Op Note (Signed)
MRN:     [id number] DOB/AGE:    1945-10-30 / 66 y.o.       OPERATIVE REPORT    DATE OF PROCEDURE:  04/26/2012       PREOPERATIVE DIAGNOSIS:   DJD LEFT KNEE      Estimated Body mass index is 37.23 kg/(m^2) as calculated from the following:   Height as of 10/28/11: 5' 4.75"(1.645 m).   Weight as of 10/28/11: 222 lb(100.699 kg).                                                        POSTOPERATIVE DIAGNOSIS:   DJD LEFT KNEE                                                                      PROCEDURE:  Procedure(s): TOTAL KNEE ARTHROPLASTY Using Depuy Sigma RP implants #2.5 Femur, #3Tibia, 12.64mm sigma RP bearing, 32 Patella     SURGEON: Eunice Winecoff A    ASSISTANT:  Kirstin Shepperson PA-C   (Present and scrubbed throughout the case, critical for assistance with exposure, retraction, instrumentation, and closure.)         ANESTHESIA: GET with Femoral Nerve Block  DRAINS: foley, 2 medium hemovac in knee   TOURNIQUET TIME:   COMPLICATIONS:  None     SPECIMENS: None   INDICATIONS FOR PROCEDURE: The patient has  DJD LEFT KNEE, varus deformities, XR shows bone on bone arthritis. Patient has failed all conservative measures including anti-inflammatory medicines, narcotics, attempts at  exercise and weight loss, cortisone injections and viscosupplementation.  Risks and benefits of surgery have been discussed, questions answered.   DESCRIPTION OF PROCEDURE: The patient identified by armband, received  right femoral nerve block and IV antibiotics, in the holding area at [facility]. Patient taken to the operating room, appropriate anesthetic  monitors were attached General endotracheal anesthesia induced with  the patient in supine position, Foley catheter was inserted. Tourniquet  applied high to the operative thigh. Lateral post and foot positioner  applied to the table, the lower extremity was then prepped and draped  in usual sterile fashion from the ankle to the tourniquet.  Time-out procedure was performed. The limb was wrapped with an Esmarch bandage and the tourniquet inflated to 365 mmHg. We began the operation by making the anterior midline incision starting at handbreadth above the patella going over the patella 1 cm medial to and  4 cm distal to the tibial tubercle. Small bleeders in the skin and the  subcutaneous tissue identified and cauterized. Transverse retinaculum was incised and reflected medially and a medial parapatellar arthrotomy was accomplished. the patella was everted and theprepatellar fat pad resected. The superficial medial collateral  ligament was then elevated from anterior to posterior along the proximal  flare of the tibia and anterior half of the menisci resected. The knee was hyperflexed exposing bone on bone arthritis. Peripheral and notch osteophytes as well as the cruciate ligaments were then resected. We continued to  work our way around posteriorly along the proximal tibia, and externally  rotated the tibia  subluxing it out from underneath the femur. A McHale  retractor was placed through the notch and a lateral Hohmann retractor  placed, and we then drilled through the proximal tibia in line with the  axis of the tibia followed by an intramedullary guide rod and 2-degree  posterior slope cutting guide. The tibial cutting guide was pinned into place  allowing resection of 6 mm of bone medially and about 4 mm of bone  laterally because of her valgus deformity. Satisfied with the tibial resection, we then  entered the distal femur 2 mm anterior to the PCL origin with the  intramedullary guide rod and applied the distal femoral cutting guide  set at 11mm, with 5 degrees of valgus. This was pinned along the  epicondylar axis. At this point, the distal femoral cut was accomplished without difficulty. We then sized for a #2.5 femoral component and pinned the guide in 3 degrees of external rotation.The chamfer cutting guide was pinned into  place. The anterior, posterior, and chamfer cuts were accomplished without difficulty followed by  the Sigma RP box cutting guide and the box cut. We also removed posterior osteophytes from the posterior femoral condyles. At this  time, the knee was brought into full extension. We checked our  extension and flexion gaps and found them symmetric at 12.47mm.  The patella thickness measured at 23 mm. We set the cutting guide at 14 and removed the posterior 9.5-10 mm  of the patella sized for 32 button and drilled the lollipop. The knee  was then once again hyperflexed exposing the proximal tibia. We sized for a #3 tibial base plate, applied the smokestack and the conical reamer followed by the the Delta fin keel punch. We then hammered into place the Sigma RP trial femoral component, inserted a 12.5-mm trial bearing, trial patellar button, and took the knee through range of motion from 0-130 degrees. No thumb pressure was required for patellar  tracking. At this point, all trial components were removed, a double batch of DePuy HV cement with 1500 mg of Zinacef was mixed and applied to all bony metallic mating surfaces except for the posterior condyles of the femur itself. In order, we  hammered into place the tibial tray and removed excess cement, the femoral component and removed excess cement, a 12.5-mm Sigma RP bearing  was inserted, and the knee brought to full extension with compression.  The patellar button was clamped into place, and excess cement  removed. While the cement cured the wound was irrigated out with normal saline solution pulse lavage, and medium Hemovac drains were placed.. Ligament stability and patellar tracking were checked and found to be excellent. The tourniquet was then released and hemostasis was obtained with cautery. The parapatellar arthrotomy was closed with  #1 ethibond suture. The subcutaneous tissue with 0 and 2-0 undyed  Vicryl suture, and 4-0 Monocryl.. A dressing of  Xeroform,  4 x 4, dressing sponges, Webril, and Ace wrap applied. Needle and sponge count were correct times 2.The patient awakened, extubated, and taken to recovery room without difficulty. Vascular status was normal, pulses 2+ and symmetric.   Braedan Meuth A 04/26/2012, 8:51 AM

## 2012-04-26 NOTE — Interval H&P Note (Signed)
History and Physical Interval Note:  04/26/2012 7:05 AM  Hailey [name]  has presented today for surgery, with the diagnosis of DJD LEFT KNEE  The various methods of treatment have been discussed with the patient and family. After consideration of risks, benefits and other options for treatment, the patient has consented to  Procedure(s) (LRB) with comments: TOTAL KNEE ARTHROPLASTY (Left) as a surgical intervention .  The patient's history has been reviewed, patient examined, no change in status, stable for surgery.  I have reviewed the patient's chart and labs.  Questions were answered to the patient's satisfaction.     Salvatore Marvel A

## 2012-04-27 ENCOUNTER — Encounter (HOSPITAL_COMMUNITY): Payer: Self-pay | Admitting: Orthopedic Surgery

## 2012-04-27 LAB — CBC
Hemoglobin: 12 g/dL (ref 12.0–15.0)
MCH: 30.5 pg (ref 26.0–34.0)
MCV: 89.6 fL (ref 78.0–100.0)
Platelets: 219 10*3/uL (ref 150–400)
RBC: 3.94 MIL/uL (ref 3.87–5.11)

## 2012-04-27 LAB — BASIC METABOLIC PANEL
CO2: 27 mEq/L (ref 19–32)
Calcium: 9 mg/dL (ref 8.4–10.5)
Creatinine, Ser: 0.83 mg/dL (ref 0.50–1.10)
Glucose, Bld: 141 mg/dL — ABNORMAL HIGH (ref 70–99)

## 2012-04-27 MED ORDER — ONDANSETRON HCL 4 MG PO TABS: ORAL_TABLET | ORAL | Status: DC

## 2012-04-27 MED ORDER — CELECOXIB 200 MG PO CAPS: ORAL_CAPSULE | ORAL | Status: DC

## 2012-04-27 MED ORDER — ENOXAPARIN SODIUM 30 MG/0.3ML ~~LOC~~ SOLN: 30.0000 mg | Freq: Two times a day (BID) | SUBCUTANEOUS | Status: DC

## 2012-04-27 MED ORDER — DSS 100 MG PO CAPS: ORAL_CAPSULE | ORAL | Status: DC

## 2012-04-27 MED ORDER — BISACODYL 5 MG PO TBEC: DELAYED_RELEASE_TABLET | ORAL | Status: DC

## 2012-04-27 MED ORDER — OXYCODONE HCL 5 MG PO TABS: ORAL_TABLET | ORAL | Status: DC

## 2012-04-27 MED ORDER — NALOXONE HCL 0.4 MG/ML IJ SOLN: 0.4000 mg | INTRAMUSCULAR | Status: DC | PRN

## 2012-04-27 MED ORDER — ACETAMINOPHEN 325 MG PO TABS: 650.0000 mg | ORAL_TABLET | Freq: Four times a day (QID) | ORAL | Status: DC | PRN

## 2012-04-27 NOTE — Plan of Care (Signed)
Problem: Discharge Progression Outcomes Goal: Activity appropriate for discharge plan Outcome: Not Met (add Reason) Pt lethargic during OT evaluation, poor recall of session due to arousal level. Daughter present and educated during session

## 2012-04-27 NOTE — Progress Notes (Signed)
CARE MANAGEMENT NOTE 04/27/2012  Patient:  Hailey [name], Hailey [name]   Account Number:  [id number]  Date Initiated:  04/27/2012  Documentation initiated by:  Vance Peper  Subjective/Objective Assessment:   66 yr old female s/p left total knee arthroplasty.     Action/Plan:   CM spoke with patient and daughter concerning Home Health and DME needs. Choice offered. Dme has been delivered to patient home.   Anticipated DC Date:  04/27/2012   Anticipated DC Plan:  HOME W HOME HEALTH SERVICES      DC Planning Services  CM consult      [facility] Choice  HOME HEALTH   Choice offered to / List presented to:  C-1 Patient        HH arranged  HH-1 RN  HH-2 PT      [facility] agency  [facility]   Status of service:  Completed, signed off Medicare Important Message given?   (If response is "NO", the following Medicare IM given date fields will be blank) Date Medicare IM given:   Date Additional Medicare IM given:    Discharge Disposition:  HOME W HOME HEALTH SERVICES  Per UR Regulation:    If discussed at Long Length of Stay Meetings, dates discussed:    Comments:

## 2012-04-27 NOTE — Evaluation (Addendum)
Occupational Therapy Evaluation Patient Details Name: Hailey [name] MRN: [id number] DOB: November 22, 1945 Today's Date: 04/27/2012 Time: 8295-6213 OT Time Calculation (min): 15 min  OT Assessment / Plan / Recommendation Clinical Impression  66 yo female s/p Lt TKA that coudl benefit from skilled OT acutely. Recommend HHOT for d/c planning    OT Assessment  Patient needs continued OT Services    Follow Up Recommendations  Home health OT    Barriers to Discharge      Equipment Recommendations  None recommended by OT    Recommendations for Other Services    Frequency  Min 2X/week    Precautions / Restrictions Precautions Precautions: Knee Required Braces or Orthoses: Knee Immobilizer - Left Knee Immobilizer - Left: On when out of bed or walking Restrictions LLE Weight Bearing: Weight bearing as tolerated   Pertinent Vitals/Pain 5 out 10 pain Dizziness BP 161/63 s/p sitting ~3-4 minutes BP 152/63 s/p sitting ~8 minutes Diaphoretic PA Shepperson present at end of session address patient and daughter     ADL  Eating/Feeding: Set up Where Assessed - Eating/Feeding: Chair Toilet Transfer: Minimal assistance Toilet Transfer Method: Sit to Barista: Regular height toilet Tub/Shower Transfer: Moderate assistance Tub/Shower Transfer Method: Science writer: Shower seat without back Equipment Used: Rolling walker;Knee Immobilizer Transfers/Ambulation Related to ADLs: Pt diaphortic during ambulation to tub and c/o dizziness and pain. Pt closing eyes at times and needing v/c for arousal. ADL Comments: Pt very lethargic during session and poor recall. Pt demonstrates awareness of drowziness and verbalizes so to therapist. Pt completed dressing with daughter and PA Shepperson  (A)ing patient. Daughter educated on dressing operated leg first and wrote notes on pad during session. Pt has shower seat at home without back. Recommend sponge bath  until more alert/ aroused.     OT Diagnosis: Generalized weakness;Acute pain  OT Problem List: Decreased strength;Decreased activity tolerance;Impaired balance (sitting and/or standing);Decreased safety awareness;Decreased knowledge of use of DME or AE;Decreased knowledge of precautions;Pain OT Treatment Interventions: Self-care/ADL training;DME and/or AE instruction;Therapeutic activities;Patient/family education;Balance training   OT Goals Acute Rehab OT Goals OT Goal Formulation: With patient/family Time For Goal Achievement: 05/11/12 Potential to Achieve Goals: Good ADL Goals Pt Will Perform Grooming: with modified independence;Standing at sink ADL Goal: Grooming - Progress: Goal set today Pt Will Perform Lower Body Bathing: with modified independence;Sit to stand from chair ADL Goal: Lower Body Bathing - Progress: Goal set today Pt Will Perform Lower Body Dressing: with modified independence;Sit to stand from chair ADL Goal: Lower Body Dressing - Progress: Goal set today Pt Will Transfer to Toilet: with modified independence;Ambulation;3-in-1 ADL Goal: Toilet Transfer - Progress: Goal set today Miscellaneous OT Goals Miscellaneous OT Goal #1: Pt will perform bed mobility MOD I level as precursor to adls OT Goal: Miscellaneous Goal #1 - Progress: Goal set today  Visit Information  Last OT Received On: 04/27/12 Assistance Needed: +1    Subjective Data  Subjective: "I am falling asleep on you"- pt lethargic and required cues for arousal. Daughter present taking notes on session. Pt will have 3 caregivers for home. PA Shepperson present at end of session.  Patient Stated Goal: to go home- per PA Shepperson pt to d/c this AM   Prior Functioning     Home Living Lives With: Spouse Available Help at Discharge: Family;Available 24 hours/day Type of Home: House Home Access: Stairs to enter [employer] of Steps: 1 Entrance Stairs-Rails: None Home Layout: One  level Bathroom Shower/Tub: Tub/shower unit  Bathroom Toilet: Handicapped height Bathroom Accessibility: Yes How Accessible: Accessible via walker Home Adaptive Equipment: Walker - rolling;Shower chair with back;Grab bars in shower Additional Comments: pt's daughters are going to stay with her first few weeks Prior Function Level of Independence: Independent with assistive device(s) Able to Take Stairs?: Yes Driving: Yes Vocation: Retired Musician: No difficulties Dominant Hand: Right         Vision/Perception     Cognition  Overall Cognitive Status: Appears within functional limits for tasks assessed/performed Arousal/Alertness: Lethargic Orientation Level: Appears intact for tasks assessed Behavior During Session: Lethargic    Extremity/Trunk Assessment Right Upper Extremity Assessment RUE ROM/Strength/Tone: [facility] for tasks assessed Left Upper Extremity Assessment LUE ROM/Strength/Tone: WFL for tasks assessed Trunk Assessment Trunk Assessment: Normal     Mobility Bed Mobility Bed Mobility: Not assessed Transfers Sit to Stand: 4: Min assist;With upper extremity assist;From chair/3-in-1 Stand to Sit: 4: Min assist;With upper extremity assist;To chair/3-in-1 Details for Transfer Assistance: pt with good hand placement and demonstrates good carry over from PT evaluation howver more lethargic. Pt needs (A) due to arousal level     Shoulder Instructions     Exercise     Balance     End of Session OT - End of Session Activity Tolerance: Other (comment) (lethargic, diaphortic and c/o dizziness during session) Patient left: in chair;with call bell/phone within reach;with family/visitor present Nurse Communication: Mobility status;Precautions  GO   Pt very lethargic and could benefit from continued OT services. Pt is moderate fall risk due to lethargic state at this time.  Harrel Carina [facility] 04/27/2012, 8:46 AM Pager: [phone]

## 2012-04-27 NOTE — Progress Notes (Signed)
Physical Therapy Treatment Patient Details Name: Hailey [name] MRN: [id number] DOB: 08/13/1945 Today's Date: 04/27/2012 Time: 4098-1191 PT Time Calculation (min): 41 min  PT Assessment / Plan / Recommendation Comments on Treatment Session  Pt admitted s/p left TKA and continues to progress with therapy. Pt able to increase ambulation distance significantly this am with ability to also perform stair negotation. Will continue to follow to ensure further independence with mobility. Pt very lethargic this am limiting ability to retain education. Daughter present throughout session and able to verbalize education provided to patient.    Follow Up Recommendations  Home health PT;Supervision for mobility/OOB     Does the patient have the potential to tolerate intense rehabilitation     Barriers to Discharge        Equipment Recommendations  None recommended by PT    Recommendations for Other Services    Frequency 7X/week   Plan Discharge plan remains appropriate;Frequency remains appropriate    Precautions / Restrictions Precautions Precautions: Knee Precaution Booklet Issued: No Required Braces or Orthoses: Knee Immobilizer - Left Knee Immobilizer - Left: Discontinue post op day 2;On when out of bed or walking Restrictions Weight Bearing Restrictions: Yes LLE Weight Bearing: Weight bearing as tolerated   Pertinent Vitals/Pain 5/10 in left knee. Pt repositioned.    Mobility  Bed Mobility Bed Mobility: Not assessed Transfers Transfers: Sit to Stand;Stand to Sit (3 trials.) Sit to Stand: 4: Min assist;With upper extremity assist;From chair/3-in-1;Other (comment) (From mat.) Stand to Sit: 4: Min assist;With upper extremity assist;To chair/3-in-1;Other (comment) (To mat.) Details for Transfer Assistance: Assist to translate trunk anterior from low surface with cues for safest hand/left LE placement. Ambulation/Gait Ambulation/Gait Assistance: 4: Min guard Ambulation Distance (Feet):  175 Feet (155 feet and 10 feet x 2 trials.) Assistive device: Rolling walker Ambulation/Gait Assistance Details: Guarding for balance with cues for safe sequence, tall posture, and safety inside RW. Gait Pattern: Step-to pattern;Decreased step length - left;Decreased stance time - left;Antalgic;Trunk flexed;Narrow base of support Stairs: Yes Stairs Assistance: 4: Min guard Stairs Assistance Details (indicate cue type and reason): Guarding for balance with cues for safe sequence using "up with good, down with bad." Stair Management Technique: Step to pattern;Backwards;With walker Number of Stairs: 1  Wheelchair Mobility Wheelchair Mobility: No    Exercises     PT Diagnosis:    PT Problem List:   PT Treatment Interventions:     PT Goals Acute Rehab PT Goals PT Goal Formulation: With patient/family Time For Goal Achievement: 05/03/12 Potential to Achieve Goals: Good PT Goal: Sit to Stand - Progress: Progressing toward goal PT Goal: Stand to Sit - Progress: Progressing toward goal PT Goal: Ambulate - Progress: Progressing toward goal PT Goal: Up/Down Stairs - Progress: Progressing toward goal  Visit Information  Last PT Received On: 04/27/12 Assistance Needed: +1    Subjective Data  Subjective: "This is hard work!" Patient Stated Goal: return home   Cognition  Overall Cognitive Status: Appears within functional limits for tasks assessed/performed Arousal/Alertness: Lethargic Orientation Level: Appears intact for tasks assessed Behavior During Session: Lethargic    Balance  Balance Balance Assessed: No  End of Session PT - End of Session Equipment Utilized During Treatment: Gait belt;Left knee immobilizer Activity Tolerance: Patient tolerated treatment well;Patient limited by fatigue Patient left: in chair;with call bell/phone within reach;with family/visitor present Nurse Communication: Mobility status   GP     Cephus Shelling 04/27/2012, 9:00  AM  04/27/2012 Cephus Shelling, PT, DPT [phone]

## 2012-04-27 NOTE — Progress Notes (Signed)
PT Treatment Note:   04/27/12 1145  PT Visit Information  Last PT Received On 04/27/12  Assistance Needed +1  PT Time Calculation  PT Start Time 1118  PT Stop Time 1132  PT Time Calculation (min) 14 min  Subjective Data  Subjective "I feel better now."  Patient Stated Goal return home  Precautions  Precautions Knee  Precaution Booklet Issued No  Required Braces or Orthoses Knee Immobilizer - Left  Knee Immobilizer - Left Discontinue post op day 2;On when out of bed or walking  Restrictions  Weight Bearing Restrictions Yes  LLE Weight Bearing WBAT  Cognition  Overall Cognitive Status Appears within functional limits for tasks assessed/performed  Arousal/Alertness Awake/alert  Orientation Level Appears intact for tasks assessed  Behavior During Session [facility] for tasks performed  Bed Mobility  Bed Mobility Not assessed  Transfers  Transfers Not assessed  Ambulation/Gait  Ambulation/Gait Assistance Not tested (comment)  Stairs No  Wheelchair Mobility  Wheelchair Mobility No  Balance  Balance Assessed No  Exercises  Exercises Total Joint  Total Joint Exercises  Ankle Circles/Pumps AROM;Left;10 reps;Supine  Quad Sets AROM;Left;10 reps;Supine  Hip ABduction/ADduction AAROM;Left;10 reps;Supine  Straight Leg Raises AAROM;Left;10 reps;Supine  PT - End of Session  Equipment Utilized During Treatment Gait belt;Left knee immobilizer  Activity Tolerance Patient tolerated treatment well  Patient left in chair;with call bell/phone within reach;with family/visitor present  Nurse Communication Mobility status  PT - Assessment/Plan  Comments on Treatment Session Pt admitted s/p left TKA and is much more alert upon PT's return to room. Pt is very motivated and able to tolerate therapuetic exercises this session. Pt still hopefully for d/c home with increased activity earlier this am. Will defer further mobility until d/c home or next session on 04/28/12.   PT Plan Discharge plan remains  appropriate;Frequency remains appropriate  PT Frequency 7X/week  Follow Up Recommendations Home health PT;Supervision for mobility/OOB  Equipment Recommended None recommended by PT  Acute Rehab PT Goals  PT Goal Formulation With patient/family  Time For Goal Achievement 05/03/12  Potential to Achieve Goals Good  PT Goal: Perform Home Exercise Program - Progress Progressing toward goal  PT General Charges  $$ ACUTE PT VISIT 1 Procedure  PT Treatments  $Therapeutic Exercise 8-22 mins    Pain:  3/10 in left knee. Pt repositioned.  04/27/2012 Cephus Shelling, PT, DPT [phone]

## 2012-04-27 NOTE — Progress Notes (Signed)
Patient discharged in stable condition via wheelchair in stable conditon. Discharge instructions and prescriptions were given and explained.

## 2012-04-27 NOTE — Progress Notes (Signed)
Called to pt room by RN and PA for desats to 82% due to bradypnea and shallow respirations.  Pt found on Auto-BiPAP.  Pt states that she wears CPAP at home at 14cmH2O.  Pt placed on Nasal CPAP of 14 with 4 Lpm O2 bled-in.  SpO2 92%.  PA aware.  Will titrate O2 today as tolerated.

## 2012-04-27 NOTE — Discharge Summary (Signed)
Patient ID: Hailey [name] MRN: [id number] DOB/AGE: 1945-11-06 66 y.o.  Admit date: 04/26/2012 Discharge date: 04/27/2012  Admission Diagnoses:  Principal Problem:  *Left knee DJD Active Problems:  Hypertension  Hyperlipidemia  Intrinsic urethral sphincter deficiency  Non-Hodgkin lymphoma  Right thyroid nodule  OSA on CPAP  Arthritis  GERD (gastroesophageal reflux disease)  Incontinence of urine  Insomnia   Discharge Diagnoses:  Same  Past Medical History  Diagnosis Date  . Hypertension   . Hyperlipidemia   . Intrinsic urethral sphincter deficiency   . Non-Hodgkin lymphoma DX  JULY 2012---  SMALL AND SLOW GROWING--  NO TX      FOLLOWED BY [facility]  . Right thyroid nodule ACCIDENTAL FINDING FROM PET SCAN  2012  . OSA on CPAP   . Arthritis KNEES AND HIPS  . GERD (gastroesophageal reflux disease)   . Frequency of urination   . Urgency of urination   . Incontinence of urine   . Nocturia   . UTI (lower urinary tract infection)   . Insomnia   . Pneumonia   . Bronchitis   . Migraine     hx of havent had one in 15 years  . Mental disorder   . Depression     Surgeries: Procedure(s): TOTAL KNEE ARTHROPLASTY on 04/26/2012   Consultants:  none  Discharged Condition: Improved  Hospital Course: Hailey [name] is an 66 y.o. female who was admitted 04/26/2012 for operative treatment ofLeft knee DJD. Patient has severe unremitting pain that affects sleep, daily activities, and work/hobbies. After pre-op clearance the patient was taken to the operating room on 04/26/2012 and underwent  Procedure(s): TOTAL KNEE ARTHROPLASTY.    Patient was given perioperative antibiotics: Anti-infectives     Start     Dose/Rate Route Frequency Ordered Stop   04/26/12 1320   acyclovir (ZOVIRAX) tablet 400 mg        400 mg Oral 5 times daily PRN 04/26/12 1320     04/26/12 0817   cefUROXime (ZINACEF) injection  Status:  Discontinued          As needed 04/26/12 0817 04/26/12  0927   04/25/12 1535   ceFAZolin (ANCEF) IVPB 2 g/50 mL premix        2 g 100 mL/hr over 30 Minutes Intravenous 60 min pre-op 04/25/12 1535 04/26/12 0733           Patient was given sequential compression devices, early ambulation, and chemoprophylaxis to prevent DVT.  In the morning post op day 1 patient was sedated but easily arousable.  She had her O2 attached to her CPAP so that when she rested she was able to maintain her O2 sat.  She has switched to mainly tylenol for pain and has been able to maintain her O2 sat and has become much more alert.  Patient benefited maximally from hospital stay and there were no other complications.    Recent vital signs: Patient Vitals for the past 24 hrs:  BP Temp Temp src Pulse Resp SpO2  04/27/12 0700 161/67 mmHg - - - - -  04/27/12 0639 151/64 mmHg 97.5 F (36.4 C) - 79  16  95 %  04/27/12 0400 - - - - 17  97 %  04/27/12 0201 145/70 mmHg 97.8 F (36.6 C) - 80  16  92 %  04/27/12 0000 - - - - 16  97 %  04/26/12 2033 141/71 mmHg 98.4 F (36.9 C) Oral 82  18  95 %  04/26/12  2000 - - - - 16  98 %     Recent laboratory studies:  Basename 04/27/12 0550 11-May-2012 0613  WBC 14.4* --  HGB 12.0 13.6  HCT 35.3* 40.0  PLT 219 --  NA 140 141  K 4.2 3.2*  CL 102 --  CO2 27 --  BUN 14 --  CREATININE 0.83 --  GLUCOSE 141* 115*  INR -- --  CALCIUM 9.0 --     Discharge Medications:     Medication List     As of 04/27/2012  2:16 PM    STOP taking these medications         estradiol 0.1 MG/GM vaginal cream   Commonly known as: ESTRACE      meloxicam 7.5 MG tablet   Commonly known as: MOBIC      TAKE these medications         acetaminophen 325 MG tablet   Commonly known as: TYLENOL   Take 2 tablets (650 mg total) by mouth every 6 (six) hours as needed (or Fever >/= 101).      acyclovir 400 MG tablet   Commonly known as: ZOVIRAX   Take 400 mg by mouth 5 (five) times daily as needed. For 1-2 days for cold sores      bisacodyl 5  MG EC tablet   Commonly known as: DULCOLAX   2 tablets before supper each night until bowel movement.  LAXATIVE.  Resume if no bowel movement in 2 days      CALCIUM 600/VITAMIN D3 600-800 MG-UNIT Tabs   Generic drug: Calcium Carb-Cholecalciferol   Take 1 tablet by mouth every morning.      celecoxib 200 MG capsule   Commonly known as: CELEBREX   1 tab daily with food.  For Arthritis      diltiazem 300 MG 24 hr capsule   Commonly known as: TIAZAC   Take 300 mg by mouth every morning.      DSS 100 MG Caps   1 tablet 2 times a day while on narcotics.  STOOL SOFTENER.      enoxaparin 30 MG/0.3ML injection   Commonly known as: LOVENOX   Inject 0.3 mLs (30 mg total) into the skin every 12 (twelve) hours.      hydrochlorothiazide 25 MG tablet   Commonly known as: HYDRODIURIL   Take 25 mg by mouth every morning.      omeprazole 20 MG capsule   Commonly known as: PRILOSEC   Take 20 mg by mouth every morning.      ondansetron 4 MG tablet   Commonly known as: ZOFRAN   1-2 tablets every 8 hrs as needed for nausea      oxybutynin 10 MG 24 hr tablet   Commonly known as: DITROPAN-XL   Take 10 mg by mouth every morning.      oxyCODONE 5 MG immediate release tablet   Commonly known as: Oxy IR/ROXICODONE   1-2 tablets every 4-6 hrs as needed for pain.      sertraline 50 MG tablet   Commonly known as: ZOLOFT   Take 50 mg by mouth every morning.      simvastatin 40 MG tablet   Commonly known as: ZOCOR   Take 40 mg by mouth every morning.        Diagnostic Studies: Dg Chest 2 View  04/20/2012  *RADIOLOGY REPORT*  Clinical Data: Left knee replacement preop evaluation  CHEST - 2 VIEW  Comparison: 02/16/2008  Findings: Heart  size is normal.  Negative for heart failure. Negative for infiltrate or effusion.  No mass lesion.  No change from the prior study.  IMPRESSION: No active cardiopulmonary disease.   Original Report Authenticated By: Janeece Riggers, M.D.     Disposition: 01-Home or  Self Care      Discharge Orders    Future Orders Please Complete By Expires   Diet - low sodium heart healthy      Call MD / Call 911      Comments:   If you experience chest pain or shortness of breath, CALL 911 and be transported to the hospital emergency room.  If you develope a fever above 101 F, pus (white drainage) or increased drainage or redness at the wound, or calf pain, call your surgeon's office.   Constipation Prevention      Comments:   Drink plenty of fluids.  Prune juice may be helpful.  You may use a stool softener, such as Colace (over the counter) 100 mg twice a day.  Use MiraLax (over the counter) for constipation as needed.   Increase activity slowly as tolerated      CPM      Comments:   Continuous passive motion machine (CPM):      Use the CPM from 0 to 90 for 6 hours per day.       You may break it up into 2 or 3 sessions per day.      Use CPM for 2 weeks or until you are told to stop.   TED hose      Comments:   Use stockings (TED hose) for 2 weeks on both leg(s).  You may remove them at night for sleeping.   Change dressing      Comments:   Change the dressing daily with sterile 4 x 4 inch gauze dressing and apply TED hose.  You may clean the incision with alcohol prior to redressing.   Do not put a pillow under the knee. Place it under the heel.      Comments:   Place yellow foam block, yellow side up under heel at all times except when in CPM or when walking.  DO NOT modify, tear, cut, or change in any way the yellow foam block.      Follow-up Information    Follow up with Nilda Simmer, MD. On 05/11/2012. (appt time 3:10 pm)    Contact information:   [address], [address] [place], [place] [address] [phone]           Signed: Pascal Lux 04/27/2012, 2:16 PM

## 2012-04-28 DIAGNOSIS — G8918 Other acute postprocedural pain: Secondary | ICD-10-CM | POA: Diagnosis not present

## 2012-04-28 DIAGNOSIS — R269 Unspecified abnormalities of gait and mobility: Secondary | ICD-10-CM | POA: Diagnosis not present

## 2012-04-28 DIAGNOSIS — Z471 Aftercare following joint replacement surgery: Secondary | ICD-10-CM | POA: Diagnosis not present

## 2012-04-28 DIAGNOSIS — Z96659 Presence of unspecified artificial knee joint: Secondary | ICD-10-CM | POA: Diagnosis not present

## 2012-04-28 DIAGNOSIS — M6281 Muscle weakness (generalized): Secondary | ICD-10-CM | POA: Diagnosis not present

## 2012-04-28 DIAGNOSIS — E669 Obesity, unspecified: Secondary | ICD-10-CM | POA: Diagnosis not present

## 2012-04-29 ENCOUNTER — Other Ambulatory Visit: Payer: Self-pay | Admitting: Physician Assistant

## 2012-04-29 DIAGNOSIS — M6281 Muscle weakness (generalized): Secondary | ICD-10-CM | POA: Diagnosis not present

## 2012-04-29 DIAGNOSIS — Z471 Aftercare following joint replacement surgery: Secondary | ICD-10-CM | POA: Diagnosis not present

## 2012-04-29 DIAGNOSIS — Z96659 Presence of unspecified artificial knee joint: Secondary | ICD-10-CM

## 2012-04-29 DIAGNOSIS — R269 Unspecified abnormalities of gait and mobility: Secondary | ICD-10-CM | POA: Diagnosis not present

## 2012-04-29 DIAGNOSIS — E669 Obesity, unspecified: Secondary | ICD-10-CM | POA: Diagnosis not present

## 2012-04-29 DIAGNOSIS — G4730 Sleep apnea, unspecified: Secondary | ICD-10-CM

## 2012-04-29 DIAGNOSIS — R7981 Abnormal blood-gas level: Secondary | ICD-10-CM

## 2012-04-29 DIAGNOSIS — G8918 Other acute postprocedural pain: Secondary | ICD-10-CM | POA: Diagnosis not present

## 2012-04-30 ENCOUNTER — Telehealth: Payer: Self-pay | Admitting: Internal Medicine

## 2012-04-30 ENCOUNTER — Ambulatory Visit (HOSPITAL_COMMUNITY)
Admission: RE | Admit: 2012-04-30 | Discharge: 2012-04-30 | Disposition: A | Discharge status: Home / Self Care | Payer: Medicare Other | Source: Ambulatory Visit | Attending: Physician Assistant | Admitting: Physician Assistant

## 2012-04-30 ENCOUNTER — Other Ambulatory Visit: Payer: Self-pay | Admitting: Physician Assistant

## 2012-04-30 DIAGNOSIS — G8918 Other acute postprocedural pain: Secondary | ICD-10-CM | POA: Diagnosis not present

## 2012-04-30 DIAGNOSIS — Z96659 Presence of unspecified artificial knee joint: Secondary | ICD-10-CM

## 2012-04-30 DIAGNOSIS — G4730 Sleep apnea, unspecified: Secondary | ICD-10-CM | POA: Diagnosis not present

## 2012-04-30 DIAGNOSIS — I10 Essential (primary) hypertension: Secondary | ICD-10-CM | POA: Diagnosis not present

## 2012-04-30 DIAGNOSIS — M6281 Muscle weakness (generalized): Secondary | ICD-10-CM | POA: Diagnosis not present

## 2012-04-30 DIAGNOSIS — J811 Chronic pulmonary edema: Secondary | ICD-10-CM | POA: Diagnosis not present

## 2012-04-30 DIAGNOSIS — R7981 Abnormal blood-gas level: Secondary | ICD-10-CM

## 2012-04-30 DIAGNOSIS — R269 Unspecified abnormalities of gait and mobility: Secondary | ICD-10-CM | POA: Diagnosis not present

## 2012-04-30 DIAGNOSIS — Z471 Aftercare following joint replacement surgery: Secondary | ICD-10-CM | POA: Diagnosis not present

## 2012-04-30 DIAGNOSIS — T783XXA Angioneurotic edema, initial encounter: Secondary | ICD-10-CM

## 2012-04-30 DIAGNOSIS — E669 Obesity, unspecified: Secondary | ICD-10-CM | POA: Diagnosis not present

## 2012-04-30 NOTE — Telephone Encounter (Signed)
I spoke with Fritzi Mandes and she stated pt was released from hospital on Tuesday after he knee replacement. Home health nurse went to visit yesterday and with exertion pt sats dropped to 83-84% RA and at rest 93-94% RA. When pt fell asleep post op her sats dropped into the 70's but placed pt on cpap and her o2 levels went up. Pt had CXR at [facility] showed early PNA and rx's avelox. Pt will be scheduled for outpt CT either today or tomorrow at [facility]. She is requesting to have pt worked in sooner than 05/05/12 if possible. Per leslie okay to work pt in 05/03/12 at 9 am. i made kirsten aware of this and have pt arrive 15 minutes early to fill out paperwork. She will make pt aware of this and our address/phone #. Nothing further was needed

## 2012-05-02 DIAGNOSIS — M6281 Muscle weakness (generalized): Secondary | ICD-10-CM | POA: Diagnosis not present

## 2012-05-02 DIAGNOSIS — R269 Unspecified abnormalities of gait and mobility: Secondary | ICD-10-CM | POA: Diagnosis not present

## 2012-05-02 DIAGNOSIS — Z96659 Presence of unspecified artificial knee joint: Secondary | ICD-10-CM | POA: Diagnosis not present

## 2012-05-02 DIAGNOSIS — R7989 Other specified abnormal findings of blood chemistry: Secondary | ICD-10-CM | POA: Diagnosis not present

## 2012-05-02 DIAGNOSIS — Z471 Aftercare following joint replacement surgery: Secondary | ICD-10-CM | POA: Diagnosis not present

## 2012-05-02 DIAGNOSIS — E669 Obesity, unspecified: Secondary | ICD-10-CM | POA: Diagnosis not present

## 2012-05-02 DIAGNOSIS — G8918 Other acute postprocedural pain: Secondary | ICD-10-CM | POA: Diagnosis not present

## 2012-05-03 ENCOUNTER — Encounter: Payer: Self-pay | Admitting: Internal Medicine

## 2012-05-03 ENCOUNTER — Other Ambulatory Visit (INDEPENDENT_AMBULATORY_CARE_PROVIDER_SITE_OTHER): Payer: Medicare Other

## 2012-05-03 ENCOUNTER — Ambulatory Visit (INDEPENDENT_AMBULATORY_CARE_PROVIDER_SITE_OTHER)
Admission: RE | Admit: 2012-05-03 | Discharge: 2012-05-03 | Disposition: A | Discharge status: Home / Self Care | Payer: Medicare Other | Source: Ambulatory Visit | Attending: Internal Medicine | Admitting: Internal Medicine

## 2012-05-03 ENCOUNTER — Ambulatory Visit (INDEPENDENT_AMBULATORY_CARE_PROVIDER_SITE_OTHER): Payer: Medicare Other | Admitting: Internal Medicine

## 2012-05-03 VITALS — BP 150/72 | HR 73 | Temp 98.3°F | Ht 65.0 in | Wt 220.6 lb

## 2012-05-03 DIAGNOSIS — R0600 Dyspnea, unspecified: Secondary | ICD-10-CM

## 2012-05-03 DIAGNOSIS — J189 Pneumonia, unspecified organism: Secondary | ICD-10-CM

## 2012-05-03 DIAGNOSIS — E669 Obesity, unspecified: Secondary | ICD-10-CM | POA: Diagnosis not present

## 2012-05-03 DIAGNOSIS — Z9989 Dependence on other enabling machines and devices: Secondary | ICD-10-CM

## 2012-05-03 DIAGNOSIS — M6281 Muscle weakness (generalized): Secondary | ICD-10-CM | POA: Diagnosis not present

## 2012-05-03 DIAGNOSIS — G8918 Other acute postprocedural pain: Secondary | ICD-10-CM | POA: Diagnosis not present

## 2012-05-03 DIAGNOSIS — G4733 Obstructive sleep apnea (adult) (pediatric): Secondary | ICD-10-CM | POA: Diagnosis not present

## 2012-05-03 DIAGNOSIS — R0989 Other specified symptoms and signs involving the circulatory and respiratory systems: Secondary | ICD-10-CM

## 2012-05-03 DIAGNOSIS — R0609 Other forms of dyspnea: Secondary | ICD-10-CM

## 2012-05-03 DIAGNOSIS — R269 Unspecified abnormalities of gait and mobility: Secondary | ICD-10-CM | POA: Diagnosis not present

## 2012-05-03 DIAGNOSIS — Z96659 Presence of unspecified artificial knee joint: Secondary | ICD-10-CM | POA: Diagnosis not present

## 2012-05-03 DIAGNOSIS — Z471 Aftercare following joint replacement surgery: Secondary | ICD-10-CM | POA: Diagnosis not present

## 2012-05-03 DIAGNOSIS — R0602 Shortness of breath: Secondary | ICD-10-CM | POA: Diagnosis not present

## 2012-05-03 LAB — BASIC METABOLIC PANEL
Calcium: 9.1 mg/dL (ref 8.4–10.5)
Creatinine, Ser: 0.9 mg/dL (ref 0.4–1.2)

## 2012-05-03 LAB — CBC WITH DIFFERENTIAL/PLATELET
Eosinophils Relative: 1.6 % (ref 0.0–5.0)
Monocytes Relative: 5.3 % (ref 3.0–12.0)
Neutrophils Relative %: 79.4 % — ABNORMAL HIGH (ref 43.0–77.0)
Platelets: 320 10*3/uL (ref 150.0–400.0)
WBC: 12.3 10*3/uL — ABNORMAL HIGH (ref 4.5–10.5)

## 2012-05-03 LAB — TSH: TSH: 1.26 u[IU]/mL (ref 0.35–5.50)

## 2012-05-03 LAB — BRAIN NATRIURETIC PEPTIDE: Pro B Natriuretic peptide (BNP): 20 pg/mL (ref 0.0–100.0)

## 2012-05-03 LAB — SEDIMENTATION RATE: Sed Rate: 113 mm/hr — ABNORMAL HIGH (ref 0–22)

## 2012-05-03 MED ORDER — LEVOFLOXACIN 750 MG PO TABS: 750.0000 mg | ORAL_TABLET | Freq: Every day | ORAL | Status: DC

## 2012-05-03 MED ORDER — LIDOCAINE HCL 1 % IJ SOLN
INTRAMUSCULAR | Status: DC | PRN
  Administered 2012-04-26: 50 mg via INTRADERMAL

## 2012-05-03 NOTE — Assessment & Plan Note (Addendum)
New infiltrates 04/30/17 from baseline clear on 11/12 with ESR 115 on 05/03/2012   This is c/w hosp acquired pna or aspiration injury vs ali but no fever and doing better on what is only day  2 or 3 of doxy which makes sense to continue this and use levquin 750 x 5 days only if not continuing to improve.  No evidence at all of chf or underlying lung dz thought note doe chronically pre-op remains unexplained in this never smoker   See instructions for specific recommendations which were reviewed directly with the patient who was given a copy with highlighter outlining the key components.

## 2012-05-03 NOTE — Addendum Note (Signed)
Addendum  created 05/03/12 0810 by Geneva Barrero R Nerea Bordenave, CRNA   Modules edited:Anesthesia Medication Administration    

## 2012-05-03 NOTE — Progress Notes (Signed)
Quick Note:  Spoke with pt and notified of results per Dr. Wert. Pt verbalized understanding and denied any questions.  ______ 

## 2012-05-03 NOTE — Patient Instructions (Addendum)
Prilosec 20 mg Take 30-60 min before first meal of the day and Pepcid 20 mg at bedtime until better  No need to push to push extra fluid.  Please remember to go to the lab and x-ray department downstairs for your tests - we will call you with the results when they are available and adjust your medications accordingly  If mucus stays green or have fever start Levaquin 750 mg one daily x 5 days.    Please see patient coordinator before you leave today  to schedule re cpap

## 2012-05-03 NOTE — Progress Notes (Signed)
  Subjective:    Patient ID: Hailey [name], female    DOB: January 13, 1946   MRN: [id number]  HPI  45 yowf never smoker on cpap preop 2009 limited by L knee but also some sob x 2012 but much worse since surgery on L Knee 04/26/12 so referred 05/03/2012 by Weiner/  Primary is Hailey [name]   05/03/2012 1st pulmonary eval cc variable very mild doe x one year, then post op 11/18 sob x 50 ft assoc  With desats/ 02 dep until disharge had improved, then,  doxy was started 11/22 with ? pna on cxr and sputum is discolored in am, nasal congestion present but no fever or cp and overall sob has improved x 48 h prior to OV    Sleeps poorly due to knee pain post op but   without nocturnal  or early am exacerbation  of respiratory  c/o's or need for noct saba. Also denies any obvious fluctuation of symptoms with weather or environmental changes or other aggravating or alleviating factors except as outlined above    Review of Systems  Constitutional: Positive for fever and chills. Negative for unexpected weight change.  HENT: Positive for congestion, trouble swallowing and postnasal drip. Negative for ear pain, nosebleeds, sore throat, rhinorrhea, sneezing, dental problem and sinus pressure.   Eyes: Negative for redness and itching.  Respiratory: Positive for shortness of breath. Negative for cough, chest tightness and wheezing.   Cardiovascular: Negative for palpitations and leg swelling.  Gastrointestinal: Positive for constipation. Negative for nausea and vomiting.  Genitourinary: Negative for dysuria.  Musculoskeletal: Negative for joint swelling.  Skin: Negative for rash.  Neurological: Positive for light-headedness. Negative for headaches.  Hematological: Does not bruise/bleed easily.  Psychiatric/Behavioral: Negative for dysphoric mood. The patient is nervous/anxious.        Objective:   Physical Exam  Tired appearing amb wf nad walking with rolling walker Wt Readings from Last 3 Encounters:  05/03/12 220  lb 9.6 oz (100.064 kg)  04/20/12 221 lb 8 oz (100.472 kg)  04/14/12 225 lb (102.059 kg)    HEENT: nl dentition, turbinates, and orophanx. Nl external ear canals without cough reflex   NECK :  without JVD/Nodes/TM/ nl carotid upstrokes bilaterally   LUNGS: no acc muscle use, clear to A and P bilaterally without cough on insp or exp maneuvers   CV:  RRR  no s3 or murmur or increase in P2, no edema   ABD:  soft and nontender with nl excursion in the supine position. No bruits or organomegaly, bowel sounds nl  MS:  warm without deformities, calf tenderness, cyanosis or clubbing. TEDS in place  SKIN: warm and dry without lesions    NEURO:  alert, approp, no deficits    CXR  05/03/2012 :  Patchy infiltrative densities seen in the posterior lower lobe areas and in the right mid lung area. There is slight nodularity of the infiltrate in the right midlung area. The infiltrates were not evident on the 11/12/ 2013 study but were present on the 11/22/ 2013 study. There is decrease in the right upper lobe infiltrative density. The infiltrative density in the right midlung zone is slightly larger. No pleural effusion is evident.  ESR 05/03/2012 115 but wbc trending down from 14 k to 12 k. BNP 20       Assessment & Plan:

## 2012-05-03 NOTE — Addendum Note (Signed)
Addendum  created 05/03/12 0810 by Patrick North, CRNA   Modules edited:Anesthesia Medication Administration

## 2012-05-04 DIAGNOSIS — M6281 Muscle weakness (generalized): Secondary | ICD-10-CM | POA: Diagnosis not present

## 2012-05-04 DIAGNOSIS — Z96659 Presence of unspecified artificial knee joint: Secondary | ICD-10-CM | POA: Diagnosis not present

## 2012-05-04 DIAGNOSIS — R269 Unspecified abnormalities of gait and mobility: Secondary | ICD-10-CM | POA: Diagnosis not present

## 2012-05-04 DIAGNOSIS — G8918 Other acute postprocedural pain: Secondary | ICD-10-CM | POA: Diagnosis not present

## 2012-05-04 DIAGNOSIS — Z471 Aftercare following joint replacement surgery: Secondary | ICD-10-CM | POA: Diagnosis not present

## 2012-05-04 DIAGNOSIS — E669 Obesity, unspecified: Secondary | ICD-10-CM | POA: Diagnosis not present

## 2012-05-05 ENCOUNTER — Institutional Professional Consult (permissible substitution): Payer: Medicare Other | Admitting: Internal Medicine

## 2012-05-05 DIAGNOSIS — Z471 Aftercare following joint replacement surgery: Secondary | ICD-10-CM | POA: Diagnosis not present

## 2012-05-05 DIAGNOSIS — G8918 Other acute postprocedural pain: Secondary | ICD-10-CM | POA: Diagnosis not present

## 2012-05-05 DIAGNOSIS — Z96659 Presence of unspecified artificial knee joint: Secondary | ICD-10-CM | POA: Diagnosis not present

## 2012-05-05 DIAGNOSIS — R269 Unspecified abnormalities of gait and mobility: Secondary | ICD-10-CM | POA: Diagnosis not present

## 2012-05-05 DIAGNOSIS — M6281 Muscle weakness (generalized): Secondary | ICD-10-CM | POA: Diagnosis not present

## 2012-05-05 DIAGNOSIS — E669 Obesity, unspecified: Secondary | ICD-10-CM | POA: Diagnosis not present

## 2012-05-07 DIAGNOSIS — Z96659 Presence of unspecified artificial knee joint: Secondary | ICD-10-CM | POA: Diagnosis not present

## 2012-05-07 DIAGNOSIS — G8918 Other acute postprocedural pain: Secondary | ICD-10-CM | POA: Diagnosis not present

## 2012-05-07 DIAGNOSIS — M6281 Muscle weakness (generalized): Secondary | ICD-10-CM | POA: Diagnosis not present

## 2012-05-07 DIAGNOSIS — E669 Obesity, unspecified: Secondary | ICD-10-CM | POA: Diagnosis not present

## 2012-05-07 DIAGNOSIS — Z471 Aftercare following joint replacement surgery: Secondary | ICD-10-CM | POA: Diagnosis not present

## 2012-05-07 DIAGNOSIS — R269 Unspecified abnormalities of gait and mobility: Secondary | ICD-10-CM | POA: Diagnosis not present

## 2012-05-09 DIAGNOSIS — R269 Unspecified abnormalities of gait and mobility: Secondary | ICD-10-CM | POA: Diagnosis not present

## 2012-05-09 DIAGNOSIS — E669 Obesity, unspecified: Secondary | ICD-10-CM | POA: Diagnosis not present

## 2012-05-09 DIAGNOSIS — M6281 Muscle weakness (generalized): Secondary | ICD-10-CM | POA: Diagnosis not present

## 2012-05-09 DIAGNOSIS — Z471 Aftercare following joint replacement surgery: Secondary | ICD-10-CM | POA: Diagnosis not present

## 2012-05-09 DIAGNOSIS — G8918 Other acute postprocedural pain: Secondary | ICD-10-CM | POA: Diagnosis not present

## 2012-05-09 DIAGNOSIS — Z96659 Presence of unspecified artificial knee joint: Secondary | ICD-10-CM | POA: Diagnosis not present

## 2012-05-10 DIAGNOSIS — E669 Obesity, unspecified: Secondary | ICD-10-CM | POA: Diagnosis not present

## 2012-05-10 DIAGNOSIS — Z471 Aftercare following joint replacement surgery: Secondary | ICD-10-CM | POA: Diagnosis not present

## 2012-05-10 DIAGNOSIS — M6281 Muscle weakness (generalized): Secondary | ICD-10-CM | POA: Diagnosis not present

## 2012-05-10 DIAGNOSIS — R269 Unspecified abnormalities of gait and mobility: Secondary | ICD-10-CM | POA: Diagnosis not present

## 2012-05-10 DIAGNOSIS — Z96659 Presence of unspecified artificial knee joint: Secondary | ICD-10-CM | POA: Diagnosis not present

## 2012-05-10 DIAGNOSIS — G8918 Other acute postprocedural pain: Secondary | ICD-10-CM | POA: Diagnosis not present

## 2012-05-11 ENCOUNTER — Institutional Professional Consult (permissible substitution): Payer: Medicare Other | Admitting: Internal Medicine

## 2012-05-11 DIAGNOSIS — Z96659 Presence of unspecified artificial knee joint: Secondary | ICD-10-CM | POA: Diagnosis not present

## 2012-05-11 DIAGNOSIS — Z471 Aftercare following joint replacement surgery: Secondary | ICD-10-CM | POA: Diagnosis not present

## 2012-05-12 DIAGNOSIS — M6281 Muscle weakness (generalized): Secondary | ICD-10-CM | POA: Diagnosis not present

## 2012-05-12 DIAGNOSIS — G8918 Other acute postprocedural pain: Secondary | ICD-10-CM | POA: Diagnosis not present

## 2012-05-12 DIAGNOSIS — R269 Unspecified abnormalities of gait and mobility: Secondary | ICD-10-CM | POA: Diagnosis not present

## 2012-05-12 DIAGNOSIS — Z96659 Presence of unspecified artificial knee joint: Secondary | ICD-10-CM | POA: Diagnosis not present

## 2012-05-12 DIAGNOSIS — E669 Obesity, unspecified: Secondary | ICD-10-CM | POA: Diagnosis not present

## 2012-05-12 DIAGNOSIS — Z471 Aftercare following joint replacement surgery: Secondary | ICD-10-CM | POA: Diagnosis not present

## 2012-05-14 DIAGNOSIS — M6281 Muscle weakness (generalized): Secondary | ICD-10-CM | POA: Diagnosis not present

## 2012-05-14 DIAGNOSIS — R262 Difficulty in walking, not elsewhere classified: Secondary | ICD-10-CM | POA: Diagnosis not present

## 2012-05-14 DIAGNOSIS — M25569 Pain in unspecified knee: Secondary | ICD-10-CM | POA: Diagnosis not present

## 2012-05-17 ENCOUNTER — Ambulatory Visit (INDEPENDENT_AMBULATORY_CARE_PROVIDER_SITE_OTHER)
Admission: RE | Admit: 2012-05-17 | Discharge: 2012-05-17 | Disposition: A | Discharge status: Home / Self Care | Payer: Medicare Other | Source: Ambulatory Visit | Attending: Internal Medicine | Admitting: Internal Medicine

## 2012-05-17 ENCOUNTER — Other Ambulatory Visit (INDEPENDENT_AMBULATORY_CARE_PROVIDER_SITE_OTHER): Payer: Medicare Other

## 2012-05-17 ENCOUNTER — Encounter: Payer: Self-pay | Admitting: Internal Medicine

## 2012-05-17 ENCOUNTER — Ambulatory Visit (INDEPENDENT_AMBULATORY_CARE_PROVIDER_SITE_OTHER): Payer: Medicare Other | Admitting: Internal Medicine

## 2012-05-17 VITALS — BP 132/66 | HR 87 | Temp 98.1°F | Ht 65.0 in | Wt 220.0 lb

## 2012-05-17 DIAGNOSIS — J189 Pneumonia, unspecified organism: Secondary | ICD-10-CM

## 2012-05-17 DIAGNOSIS — R059 Cough, unspecified: Secondary | ICD-10-CM | POA: Diagnosis not present

## 2012-05-17 DIAGNOSIS — R05 Cough: Secondary | ICD-10-CM | POA: Diagnosis not present

## 2012-05-17 LAB — CBC WITH DIFFERENTIAL/PLATELET
Basophils Absolute: 0 10*3/uL (ref 0.0–0.1)
Basophils Relative: 0.3 % (ref 0.0–3.0)
Hemoglobin: 12.1 g/dL (ref 12.0–15.0)
Lymphocytes Relative: 31.7 % (ref 12.0–46.0)
Monocytes Relative: 7.6 % (ref 3.0–12.0)
Neutro Abs: 3.6 10*3/uL (ref 1.4–7.7)
RBC: 4.06 Mil/uL (ref 3.87–5.11)
RDW: 14 % (ref 11.5–14.6)

## 2012-05-17 NOTE — Patient Instructions (Addendum)
Please remember to go to the lab and x-ray department downstairs for your tests - we will call you with the results when they are available.  Please let Dr Wyline Mood know if knee bothers you any more over the next several weeks.

## 2012-05-17 NOTE — Progress Notes (Signed)
  Subjective:    Patient ID: Hailey [name], female    DOB: [birth year]   MRN: [id number]  HPI  40 yowf never smoker on cpap preop 2009 limited by L knee but also some sob x 2012 but much worse since surgery on L Knee 04/26/12 so referred 05/03/2012 by Hailey [name]/ Primary is Hailey [name]   05/03/2012 1st pulmonary eval cc variable very mild doe x one year, then post op 11/18 sob x 50 ft assoc  With desats/ 02 dep until disharge had improved, then,  doxy was started 11/22 with ? pna on cxr and sputum is discolored in am, nasal congestion present but no fever or cp and overall sob has improved x 48 h prior to OV. rec Prilosec 20 mg Take 30-60 min before first meal of the day and Pepcid 20 mg at bedtime until better No need to push to push extra fluid. If mucus stays green or have fever start Levaquin 750 mg one daily x 5 days > did not need it   05/17/2012 f/u ov/Hailey [name] cc much better, improving energy, not really limted by breathing.   No obvious daytime variabilty or assoc chronic cough or cp or chest tightness, subjective wheeze overt sinus or hb symptoms. No unusual exp hx   Sleeping ok without nocturnal  or early am exacerbation  of respiratory  c/o's or need for noct saba. Also denies any obvious fluctuation of symptoms with weather or environmental changes or other aggravating or alleviating factors except as outlined above   ROS  The following are not active complaints unless bolded sore throat, dysphagia, dental problems, itching, sneezing,  nasal congestion or excess/ purulent secretions, ear ache,   fever, chills, sweats, unintended wt loss, pleuritic or exertional cp, hemoptysis,  orthopnea pnd or leg swelling, presyncope, palpitations, heartburn, abdominal pain, anorexia, nausea, vomiting, diarrhea  or change in bowel or urinary habits, change in stools or urine, dysuria,hematuria,  rash, arthralgias, visual complaints, headache, numbness weakness or ataxia or problems with walking or coordination,   change in mood/affect or memory.             Objective:   Physical Exam  Much more robust than previous eval Wt 05/17/12 220  Wt Readings from Last 3 Encounters:  05/03/12 220 lb 9.6 oz (100.064 kg)  04/20/12 221 lb 8 oz (100.472 kg)  04/14/12 225 lb (102.059 kg)    HEENT: nl dentition, turbinates, and orophanx. Nl external ear canals without cough reflex   NECK :  without JVD/Nodes/TM/ nl carotid upstrokes bilaterally   LUNGS: no acc muscle use, clear to A and P bilaterally without cough on insp or exp maneuvers   CV:  RRR  no s3 or murmur or increase in P2, no edema   ABD:  soft and nontender with nl excursion in the supine position. No bruits or organomegaly, bowel sounds nl  MS:  warm without deformities, calf tenderness, cyanosis or clubbing. TEDS in place L knee scar slt erythema,  No drainage           CXR  05/17/2012 :  Almost complete clearing of the infiltrates with slight residual hazy infiltrate at the right base posterior medially.         Assessment & Plan:

## 2012-05-17 NOTE — Progress Notes (Signed)
Quick Note:  Spoke with pt and notified of results per Dr. Wert. Pt verbalized understanding and denied any questions.  ______ 

## 2012-05-18 DIAGNOSIS — E782 Mixed hyperlipidemia: Secondary | ICD-10-CM | POA: Diagnosis not present

## 2012-05-18 DIAGNOSIS — M6281 Muscle weakness (generalized): Secondary | ICD-10-CM | POA: Diagnosis not present

## 2012-05-18 DIAGNOSIS — M25569 Pain in unspecified knee: Secondary | ICD-10-CM | POA: Diagnosis not present

## 2012-05-18 DIAGNOSIS — I10 Essential (primary) hypertension: Secondary | ICD-10-CM | POA: Diagnosis not present

## 2012-05-18 DIAGNOSIS — R262 Difficulty in walking, not elsewhere classified: Secondary | ICD-10-CM | POA: Diagnosis not present

## 2012-05-18 NOTE — Assessment & Plan Note (Signed)
New infiltrates 04/30/17 from baseline clear on 11/12 with ESR 115 on 05/03/2012  > 05/17/12  50  Resolving nicely with minimal rx is most c/w ali,  Not HCAP, but either way she's doing great and no further pulmonary rx or f/u needed at this point

## 2012-05-19 DIAGNOSIS — M6281 Muscle weakness (generalized): Secondary | ICD-10-CM | POA: Diagnosis not present

## 2012-05-19 DIAGNOSIS — R262 Difficulty in walking, not elsewhere classified: Secondary | ICD-10-CM | POA: Diagnosis not present

## 2012-05-19 DIAGNOSIS — M25569 Pain in unspecified knee: Secondary | ICD-10-CM | POA: Diagnosis not present

## 2012-05-21 DIAGNOSIS — M25569 Pain in unspecified knee: Secondary | ICD-10-CM | POA: Diagnosis not present

## 2012-05-21 DIAGNOSIS — R262 Difficulty in walking, not elsewhere classified: Secondary | ICD-10-CM | POA: Diagnosis not present

## 2012-05-21 DIAGNOSIS — M6281 Muscle weakness (generalized): Secondary | ICD-10-CM | POA: Diagnosis not present

## 2012-05-24 DIAGNOSIS — M25569 Pain in unspecified knee: Secondary | ICD-10-CM | POA: Diagnosis not present

## 2012-05-24 DIAGNOSIS — R262 Difficulty in walking, not elsewhere classified: Secondary | ICD-10-CM | POA: Diagnosis not present

## 2012-05-24 DIAGNOSIS — M6281 Muscle weakness (generalized): Secondary | ICD-10-CM | POA: Diagnosis not present

## 2012-05-25 DIAGNOSIS — I10 Essential (primary) hypertension: Secondary | ICD-10-CM | POA: Diagnosis not present

## 2012-05-25 DIAGNOSIS — R609 Edema, unspecified: Secondary | ICD-10-CM | POA: Diagnosis not present

## 2012-05-26 DIAGNOSIS — M6281 Muscle weakness (generalized): Secondary | ICD-10-CM | POA: Diagnosis not present

## 2012-05-26 DIAGNOSIS — M25569 Pain in unspecified knee: Secondary | ICD-10-CM | POA: Diagnosis not present

## 2012-05-26 DIAGNOSIS — R262 Difficulty in walking, not elsewhere classified: Secondary | ICD-10-CM | POA: Diagnosis not present

## 2012-05-28 DIAGNOSIS — R262 Difficulty in walking, not elsewhere classified: Secondary | ICD-10-CM | POA: Diagnosis not present

## 2012-05-28 DIAGNOSIS — M6281 Muscle weakness (generalized): Secondary | ICD-10-CM | POA: Diagnosis not present

## 2012-05-28 DIAGNOSIS — M25569 Pain in unspecified knee: Secondary | ICD-10-CM | POA: Diagnosis not present

## 2012-05-31 DIAGNOSIS — R05 Cough: Secondary | ICD-10-CM | POA: Diagnosis not present

## 2012-05-31 DIAGNOSIS — J0100 Acute maxillary sinusitis, unspecified: Secondary | ICD-10-CM | POA: Diagnosis not present

## 2012-06-08 DIAGNOSIS — M6281 Muscle weakness (generalized): Secondary | ICD-10-CM | POA: Diagnosis not present

## 2012-06-08 DIAGNOSIS — M25569 Pain in unspecified knee: Secondary | ICD-10-CM | POA: Diagnosis not present

## 2012-06-08 DIAGNOSIS — R262 Difficulty in walking, not elsewhere classified: Secondary | ICD-10-CM | POA: Diagnosis not present

## 2012-06-10 DIAGNOSIS — R262 Difficulty in walking, not elsewhere classified: Secondary | ICD-10-CM | POA: Diagnosis not present

## 2012-06-10 DIAGNOSIS — M6281 Muscle weakness (generalized): Secondary | ICD-10-CM | POA: Diagnosis not present

## 2012-06-10 DIAGNOSIS — M25569 Pain in unspecified knee: Secondary | ICD-10-CM | POA: Diagnosis not present

## 2012-06-11 DIAGNOSIS — M6281 Muscle weakness (generalized): Secondary | ICD-10-CM | POA: Diagnosis not present

## 2012-06-11 DIAGNOSIS — R262 Difficulty in walking, not elsewhere classified: Secondary | ICD-10-CM | POA: Diagnosis not present

## 2012-06-11 DIAGNOSIS — M25569 Pain in unspecified knee: Secondary | ICD-10-CM | POA: Diagnosis not present

## 2012-06-14 ENCOUNTER — Ambulatory Visit (INDEPENDENT_AMBULATORY_CARE_PROVIDER_SITE_OTHER): Payer: Medicare Other | Admitting: Pulmonary Disease

## 2012-06-14 ENCOUNTER — Encounter: Payer: Self-pay | Admitting: Pulmonary Disease

## 2012-06-14 VITALS — BP 142/88 | HR 73 | Temp 98.3°F | Ht 65.0 in | Wt 214.0 lb

## 2012-06-14 DIAGNOSIS — M25569 Pain in unspecified knee: Secondary | ICD-10-CM | POA: Diagnosis not present

## 2012-06-14 DIAGNOSIS — M6281 Muscle weakness (generalized): Secondary | ICD-10-CM | POA: Diagnosis not present

## 2012-06-14 DIAGNOSIS — G4733 Obstructive sleep apnea (adult) (pediatric): Secondary | ICD-10-CM | POA: Diagnosis not present

## 2012-06-14 DIAGNOSIS — R262 Difficulty in walking, not elsewhere classified: Secondary | ICD-10-CM | POA: Diagnosis not present

## 2012-06-14 NOTE — Patient Instructions (Addendum)
Will have DRS work with you on fitting with a full face mask.  Please let me know if you continue to struggle with fit. Will have your pressure optimized on an auto device for 2 weeks.  Will let you know your optimal pressure. Don't forget you can turn up the heater on your humidifier to get more moisture if needed. Work on weight loss.  followup with me in one year if doing well, but will call you with results of your download when available.

## 2012-06-14 NOTE — Assessment & Plan Note (Signed)
The patient has a history of moderate obstructive sleep apnea dating back to 2009, and now is having issues with CPAP tolerance.  I suspect a lot of this is the mask fit, and that she would do better with a full face mask because of mouth opening.  I would also like to optimize her pressure again.  At the same time, we can have her new dme check the functioning of her machine.  Hopefully this will improve her tolerance and compliance.  I have also encouraged her to work aggressively on weight loss.

## 2012-06-14 NOTE — Progress Notes (Signed)
Subjective:    Patient ID: Hailey [name], female    DOB: [birth year], 67 y.o.   MRN: [id number]  HPI The patient is a 67 year old female who comes in today as a self-referral for management of obstructive sleep apnea.  She was diagnosed with moderate OSA in 2009, with an AHI of 26 events per hour.  She underwent a CPAP titration study where her optimal pressure was 10 cm.  She was started on CPAP, and has done fairly well with the device in total most recently.  She uses a nasal CPAP mask, as well as a heated humidifier.  The patient states that she had a total knee replacement the middle of November, and since that time she has had poor tolerance of her CPAP.  She is having a lot of mask leak with air blowing into her eyes, and her current mask was obtained in June.  She does not feel that it fits well, is uncomfortable, and has ongoing leaks.  She is also having a lot of dryness, and has not adjusted her heated humidifier accordingly.  The patient states she has not had any pressure changes to her device since 2009.  She is currently reestablished with DRS in [place].  The patient partner does not hear her snoring, but does state that she opens her mouth during the night quite a bit.  The patient does not feel rested in the mornings upon arising, and does have some daytime sleepiness.  Her Epworth sleepiness score today is 13.  She does state that her weight is down 15 pounds most recently.  Sleep Questionnaire: What time do you typically go to bed?( Between what hours) 11pm to 4am at times How long does it take you to fall asleep? at times several hours How many times during the night do you wake up? 3 What time do you get out of bed to start your day? 0830 Do you drive or operate heavy machinery in your occupation? No How much has your weight changed (up or down) over the past two years? (In pounds) 15 lb (6.804 kg) Have you ever had a sleep study before? Yes If yes, location of study? [place] If yes, date of  study? June 2009 Do you currently use CPAP? Yes If so, what pressure? unsure Do you wear oxygen at any time? No    Review of Systems  Constitutional: Positive for appetite change ( since surgery on knee in 04/2012 and Flu over Christmas). Negative for fever and unexpected weight change.  HENT: Negative for ear pain, nosebleeds, congestion, sore throat, rhinorrhea, sneezing, trouble swallowing, dental problem, postnasal drip and sinus pressure.   Eyes: Negative for redness and itching.  Respiratory: Positive for cough ( productive). Negative for chest tightness, shortness of breath and wheezing.   Cardiovascular: Positive for leg swelling ( d/t surgery on Left knee). Negative for palpitations.  Gastrointestinal: Negative for nausea and vomiting.  Genitourinary: Negative for dysuria.  Musculoskeletal: Negative for joint swelling.  Skin: Negative for rash.  Neurological: Negative for headaches.  Hematological: Does not bruise/bleed easily.  Psychiatric/Behavioral: Negative for dysphoric mood. The patient is not nervous/anxious.        Objective:   Physical Exam Constitutional:  Obese female, no acute distress  HENT:  Nares patent without discharge  Oropharynx without exudate, palate and uvula are normal  Eyes:  Perrla, eomi, no scleral icterus  Neck:  No JVD, no TMG  Cardiovascular:  Normal rate, regular rhythm, no rubs or gallops.  No  murmurs        Intact distal pulses  Pulmonary :  Normal breath sounds, no stridor or respiratory distress   No rales, rhonchi, or wheezing  Abdominal:  Soft, nondistended, bowel sounds present.  No tenderness noted.   Musculoskeletal:  1+ lower extremity edema noted.  Lymph Nodes:  No cervical lymphadenopathy noted  Skin:  No cyanosis noted  Neurologic:  Alert, appropriate, moves all 4 extremities without obvious deficit.         Assessment & Plan:

## 2012-06-15 ENCOUNTER — Telehealth: Payer: Self-pay | Admitting: Pulmonary Disease

## 2012-06-15 DIAGNOSIS — E7800 Pure hypercholesterolemia, unspecified: Secondary | ICD-10-CM | POA: Diagnosis not present

## 2012-06-15 DIAGNOSIS — I10 Essential (primary) hypertension: Secondary | ICD-10-CM | POA: Diagnosis not present

## 2012-06-15 NOTE — Telephone Encounter (Signed)
Spoke to leandra pt is going to call the office and get an appt to have cpap titration done @the  sleep ctr [facility]

## 2012-06-16 ENCOUNTER — Telehealth: Payer: Self-pay | Admitting: Pulmonary Disease

## 2012-06-16 NOTE — Telephone Encounter (Signed)
Hailey [name] 06/15/2012 4:32 PM Signed  Spoke to leandra pt is going to call the office and get an appt to have cpap titration done @the  sleep ctr Hailey [name] ----   Please advise PCC's thanks

## 2012-06-17 ENCOUNTER — Other Ambulatory Visit: Payer: Self-pay | Admitting: Pulmonary Disease

## 2012-06-17 DIAGNOSIS — G4733 Obstructive sleep apnea (adult) (pediatric): Secondary | ICD-10-CM

## 2012-06-17 NOTE — Telephone Encounter (Signed)
Let pt know that I am happy to do the titration study at the hospital, but also tell her that her dme is inadequate if she cannot get an auto study done.  They will not be able to meet her needs going forward, and I would consider changing.  Order sent to pcc .

## 2012-06-17 NOTE — Telephone Encounter (Signed)
Her dr needs to be aware her homecare company does not have acess to to cpapauto and that pt wants to do cpap titration at sleep ctenter so they can give [place] an order for this thanks libby

## 2012-06-17 NOTE — Telephone Encounter (Signed)
Per Almyra Free, pt's DME does not have access to do a cpap auto.  Pt would like to go to the sleep center to have an auto cpap titration done.  Dr. Shelle Iron, pls advise if you are ok with this order.  Thank you.

## 2012-06-17 NOTE — Telephone Encounter (Signed)
cpap titration study@[facility] 07/12/12 pt is aware and info packet mailed to pt [facility]

## 2012-06-17 NOTE — Telephone Encounter (Signed)
Called, spoke with pt.  I informed her of below per Dr. Shelle Iron. She verbalized understanding of this.  Pt states she wouldn't mind changing DME co but is unsure of who she could go to.  She would like someone close to [place].  Also, if she could have the auto done at home with the new co she would rather do this than go to the sleep center.  Advised I would send msg to PCCs to have them assist with this.  Pls advise.  Thank you.

## 2012-06-18 DIAGNOSIS — R262 Difficulty in walking, not elsewhere classified: Secondary | ICD-10-CM | POA: Diagnosis not present

## 2012-06-18 DIAGNOSIS — M6281 Muscle weakness (generalized): Secondary | ICD-10-CM | POA: Diagnosis not present

## 2012-06-18 DIAGNOSIS — M25569 Pain in unspecified knee: Secondary | ICD-10-CM | POA: Diagnosis not present

## 2012-06-21 DIAGNOSIS — M6281 Muscle weakness (generalized): Secondary | ICD-10-CM | POA: Diagnosis not present

## 2012-06-21 DIAGNOSIS — R262 Difficulty in walking, not elsewhere classified: Secondary | ICD-10-CM | POA: Diagnosis not present

## 2012-06-21 DIAGNOSIS — M25569 Pain in unspecified knee: Secondary | ICD-10-CM | POA: Diagnosis not present

## 2012-06-24 DIAGNOSIS — M25569 Pain in unspecified knee: Secondary | ICD-10-CM | POA: Diagnosis not present

## 2012-06-24 DIAGNOSIS — R262 Difficulty in walking, not elsewhere classified: Secondary | ICD-10-CM | POA: Diagnosis not present

## 2012-06-24 DIAGNOSIS — M6281 Muscle weakness (generalized): Secondary | ICD-10-CM | POA: Diagnosis not present

## 2012-06-25 ENCOUNTER — Ambulatory Visit (INDEPENDENT_AMBULATORY_CARE_PROVIDER_SITE_OTHER): Payer: Medicare Other | Admitting: Urology

## 2012-06-25 DIAGNOSIS — R82998 Other abnormal findings in urine: Secondary | ICD-10-CM

## 2012-06-25 DIAGNOSIS — I10 Essential (primary) hypertension: Secondary | ICD-10-CM | POA: Diagnosis not present

## 2012-06-25 DIAGNOSIS — N3946 Mixed incontinence: Secondary | ICD-10-CM | POA: Diagnosis not present

## 2012-06-28 DIAGNOSIS — M25569 Pain in unspecified knee: Secondary | ICD-10-CM | POA: Diagnosis not present

## 2012-06-28 DIAGNOSIS — M6281 Muscle weakness (generalized): Secondary | ICD-10-CM | POA: Diagnosis not present

## 2012-06-28 DIAGNOSIS — R262 Difficulty in walking, not elsewhere classified: Secondary | ICD-10-CM | POA: Diagnosis not present

## 2012-07-01 DIAGNOSIS — R262 Difficulty in walking, not elsewhere classified: Secondary | ICD-10-CM | POA: Diagnosis not present

## 2012-07-01 DIAGNOSIS — M25569 Pain in unspecified knee: Secondary | ICD-10-CM | POA: Diagnosis not present

## 2012-07-01 DIAGNOSIS — M6281 Muscle weakness (generalized): Secondary | ICD-10-CM | POA: Diagnosis not present

## 2012-07-12 ENCOUNTER — Ambulatory Visit (HOSPITAL_BASED_OUTPATIENT_CLINIC_OR_DEPARTMENT_OTHER): Payer: Medicare Other | Attending: Pulmonary Disease

## 2012-07-12 VITALS — Ht 65.0 in | Wt 210.0 lb

## 2012-07-12 DIAGNOSIS — G4733 Obstructive sleep apnea (adult) (pediatric): Secondary | ICD-10-CM | POA: Diagnosis not present

## 2012-07-12 DIAGNOSIS — Z9989 Dependence on other enabling machines and devices: Secondary | ICD-10-CM

## 2012-07-12 DIAGNOSIS — M25569 Pain in unspecified knee: Secondary | ICD-10-CM | POA: Diagnosis not present

## 2012-07-12 DIAGNOSIS — M6281 Muscle weakness (generalized): Secondary | ICD-10-CM | POA: Diagnosis not present

## 2012-07-12 DIAGNOSIS — R262 Difficulty in walking, not elsewhere classified: Secondary | ICD-10-CM | POA: Diagnosis not present

## 2012-07-13 DIAGNOSIS — M76899 Other specified enthesopathies of unspecified lower limb, excluding foot: Secondary | ICD-10-CM | POA: Diagnosis not present

## 2012-07-15 DIAGNOSIS — R262 Difficulty in walking, not elsewhere classified: Secondary | ICD-10-CM | POA: Diagnosis not present

## 2012-07-15 DIAGNOSIS — M25569 Pain in unspecified knee: Secondary | ICD-10-CM | POA: Diagnosis not present

## 2012-07-15 DIAGNOSIS — M6281 Muscle weakness (generalized): Secondary | ICD-10-CM | POA: Diagnosis not present

## 2012-07-20 DIAGNOSIS — C8589 Other specified types of non-Hodgkin lymphoma, extranodal and solid organ sites: Secondary | ICD-10-CM | POA: Diagnosis not present

## 2012-07-22 DIAGNOSIS — G4710 Hypersomnia, unspecified: Secondary | ICD-10-CM | POA: Diagnosis not present

## 2012-07-22 DIAGNOSIS — G4730 Sleep apnea, unspecified: Secondary | ICD-10-CM

## 2012-07-22 NOTE — Procedures (Signed)
Hailey, [name]                 ACCOUNT NO.:  [id number]  MEDICAL RECORD NO.:  [id number]          PATIENT TYPE:  OUT  LOCATION:  SLEEP CENTER                 FACILITY:  [facility]  PHYSICIAN:  Barbaraann Share, MD,FCCPDATE OF BIRTH:  03[birth year]15[birth year]1945  DATE OF STUDY:  07/12/2012                           NOCTURNAL POLYSOMNOGRAM  REFERRING PHYSICIAN:  Barbaraann Share, MD,FCCP  INDICATION FOR STUDY:  Hypersomnia with sleep apnea.  EPWORTH SLEEPINESS SCORE:  13.  SLEEP ARCHITECTURE:  The patient had a total sleep time of 274 minutes with very little slow-wave sleep or REM noted.  Sleep onset latency was normal at 23 minutes and REM onset was very prolonged at 214 minutes. Sleep efficiency was moderately reduced at 74%.  RESPIRATORY DATA:  The patient underwent a CPAP titration study for pressure optimization.  She was fitted with a ResMed small Quattro Mirage full face mask, and CPAP titration was initiated at 5 cm of water.  The pressure was increased to control both obstructive events and snoring, and she appeared to have an optimal pressure of 11 cm of water.  This pressure controlled her events in the supine position, however, she did not achieve REM on the final pressure.  Tolerance appeared to be excellent.  OXYGEN DATA:  There was O2 desaturation as low as 84% with the patient's obstructive events prior to CPAP optimization.  CARDIAC DATA:  No clinically significant arrhythmias were noted.  MOVEMENT-PARASOMNIA:  The patient was found to have very large numbers of limb movements with what appears to be sleep disruption.  She did not have any abnormal behavior seen.  IMPRESSIONS-RECOMMENDATIONS: 1. Good control of previously documented obstructive sleep apnea with     a CPAP pressure of 11 cm of water, and delivered by a ResMed small     Quattro Mirage full face mask.  The patient should also be     encouraged to work aggressively on weight loss. 2. Very large numbers of periodic  limb movements with sleep disruption     noted.  It is unclear if this is related to     the patient's sleep-disordered breathing, or whether she may have a     primary movement disorder of sleep as well.  Clinical correlation     is suggested after treatment of her sleep-disordered breathing.     Barbaraann Share, MD,FCCP Diplomate, American Board of Sleep Medicine    [facility]/MEDQ  D:  07/22/2012 10:53:49  T:  07/22/2012 19:55:07  Job:  [id number]

## 2012-07-27 DIAGNOSIS — K7689 Other specified diseases of liver: Secondary | ICD-10-CM | POA: Diagnosis not present

## 2012-07-27 DIAGNOSIS — R911 Solitary pulmonary nodule: Secondary | ICD-10-CM | POA: Diagnosis not present

## 2012-07-27 DIAGNOSIS — C8589 Other specified types of non-Hodgkin lymphoma, extranodal and solid organ sites: Secondary | ICD-10-CM | POA: Diagnosis not present

## 2012-07-27 DIAGNOSIS — N281 Cyst of kidney, acquired: Secondary | ICD-10-CM | POA: Diagnosis not present

## 2012-07-30 ENCOUNTER — Ambulatory Visit (INDEPENDENT_AMBULATORY_CARE_PROVIDER_SITE_OTHER): Payer: Medicare Other | Admitting: Urology

## 2012-08-02 ENCOUNTER — Encounter: Payer: Self-pay | Admitting: Pulmonary Disease

## 2012-08-02 ENCOUNTER — Telehealth: Payer: Self-pay | Admitting: Pulmonary Disease

## 2012-08-02 ENCOUNTER — Other Ambulatory Visit: Payer: Self-pay | Admitting: Pulmonary Disease

## 2012-08-02 NOTE — Telephone Encounter (Signed)
Pt was seen 06/2012 and told to follow up in 1 year; would like to have sleep study results. Please advise as soon as possible.

## 2012-08-02 NOTE — Telephone Encounter (Signed)
I sent note to [facility] to handle and with new orders.

## 2012-08-03 NOTE — Telephone Encounter (Signed)
Pt notified by [facility] of sleep study results and new orders.

## 2012-08-04 ENCOUNTER — Encounter: Payer: Medicare Other | Admitting: Internal Medicine

## 2012-08-04 DIAGNOSIS — C8299 Follicular lymphoma, unspecified, extranodal and solid organ sites: Secondary | ICD-10-CM

## 2012-09-03 DIAGNOSIS — N3941 Urge incontinence: Secondary | ICD-10-CM | POA: Diagnosis not present

## 2012-09-06 ENCOUNTER — Encounter: Payer: Self-pay | Admitting: Pulmonary Disease

## 2012-09-06 ENCOUNTER — Ambulatory Visit (INDEPENDENT_AMBULATORY_CARE_PROVIDER_SITE_OTHER): Payer: Medicare Other | Admitting: Pulmonary Disease

## 2012-09-06 VITALS — BP 128/72 | HR 73 | Temp 98.2°F | Ht 65.0 in | Wt 200.8 lb

## 2012-09-06 DIAGNOSIS — G4733 Obstructive sleep apnea (adult) (pediatric): Secondary | ICD-10-CM

## 2012-09-06 NOTE — Assessment & Plan Note (Signed)
The patient is doing well on CPAP after her pressure optimization, and is tolerating the full face mask very well.  She feels that she is sleeping well, with improved daytime alertness.  She has lost significant weight since her last visit.  I have asked her to keep up with her mask changes and supplies, and to continue with her weight loss.  She will see me again in one year.

## 2012-09-06 NOTE — Progress Notes (Signed)
  Subjective:    Patient ID: Hailey [name], female    DOB: [birth year], 67 y.o.   MRN: [id number]  HPI The patient comes in today for followup of her obstructive sleep apnea.  Her pressure has been optimized to 11 cm of water, and she is tolerating her change to a full face mask.  She feels that she is sleeping better with the device, but I have asked her to try and increase her use of 6-7 hours a night on a consistent basis.  She also feels her daytime alertness is improved.  She has lost significant weight since the last visit,  and I have commended her on this.   Review of Systems  Constitutional: Negative for fever and unexpected weight change.  HENT: Negative for ear pain, nosebleeds, congestion, sore throat, rhinorrhea, sneezing, trouble swallowing, dental problem, postnasal drip and sinus pressure.   Eyes: Negative for redness and itching.  Respiratory: Negative for cough, chest tightness, shortness of breath and wheezing.   Cardiovascular: Negative for palpitations and leg swelling.  Gastrointestinal: Negative for nausea and vomiting.  Genitourinary: Negative for dysuria.  Musculoskeletal: Negative for joint swelling.  Skin: Negative for rash.  Neurological: Negative for headaches.  Hematological: Does not bruise/bleed easily.  Psychiatric/Behavioral: Negative for dysphoric mood. The patient is not nervous/anxious.        Objective:   Physical Exam Overweight female in no acute distress Nose without purulent discharge noted No skin breakdown or pressure necrosis from the CPAP mask Neck without lymphadenopathy or thyromegaly Lower extremities without significant edema, no cyanosis Alert, moves all 4 extremities, does not appear to be sleepy.       Assessment & Plan:

## 2012-09-06 NOTE — Patient Instructions (Addendum)
Continue with cpap at current pressure setting, and keep up with mask seal changes. Work on further weight loss.  You are doing well. followup me in one year.

## 2012-09-10 DIAGNOSIS — N3941 Urge incontinence: Secondary | ICD-10-CM | POA: Diagnosis not present

## 2012-09-14 ENCOUNTER — Other Ambulatory Visit: Payer: Self-pay

## 2012-09-14 DIAGNOSIS — Z1231 Encounter for screening mammogram for malignant neoplasm of breast: Secondary | ICD-10-CM

## 2012-09-17 DIAGNOSIS — N3941 Urge incontinence: Secondary | ICD-10-CM | POA: Diagnosis not present

## 2012-10-01 DIAGNOSIS — N3941 Urge incontinence: Secondary | ICD-10-CM | POA: Diagnosis not present

## 2012-10-08 DIAGNOSIS — N3941 Urge incontinence: Secondary | ICD-10-CM | POA: Diagnosis not present

## 2012-10-12 DIAGNOSIS — M25519 Pain in unspecified shoulder: Secondary | ICD-10-CM | POA: Diagnosis not present

## 2012-10-13 DIAGNOSIS — H2510 Age-related nuclear cataract, unspecified eye: Secondary | ICD-10-CM | POA: Diagnosis not present

## 2012-10-13 DIAGNOSIS — H538 Other visual disturbances: Secondary | ICD-10-CM | POA: Diagnosis not present

## 2012-10-15 DIAGNOSIS — N3941 Urge incontinence: Secondary | ICD-10-CM | POA: Diagnosis not present

## 2012-10-22 ENCOUNTER — Ambulatory Visit
Admission: RE | Admit: 2012-10-22 | Discharge: 2012-10-22 | Disposition: A | Discharge status: Home / Self Care | Payer: Medicare Other | Source: Ambulatory Visit

## 2012-10-22 DIAGNOSIS — N3941 Urge incontinence: Secondary | ICD-10-CM | POA: Diagnosis not present

## 2012-10-22 DIAGNOSIS — Z1231 Encounter for screening mammogram for malignant neoplasm of breast: Secondary | ICD-10-CM

## 2012-10-25 DIAGNOSIS — R350 Frequency of micturition: Secondary | ICD-10-CM | POA: Diagnosis not present

## 2012-10-25 DIAGNOSIS — D649 Anemia, unspecified: Secondary | ICD-10-CM | POA: Diagnosis not present

## 2012-10-25 DIAGNOSIS — Z124 Encounter for screening for malignant neoplasm of cervix: Secondary | ICD-10-CM | POA: Diagnosis not present

## 2012-10-25 DIAGNOSIS — Z01419 Encounter for gynecological examination (general) (routine) without abnormal findings: Secondary | ICD-10-CM | POA: Diagnosis not present

## 2012-10-25 DIAGNOSIS — N952 Postmenopausal atrophic vaginitis: Secondary | ICD-10-CM | POA: Diagnosis not present

## 2012-11-05 DIAGNOSIS — N3941 Urge incontinence: Secondary | ICD-10-CM | POA: Diagnosis not present

## 2012-11-09 DIAGNOSIS — R609 Edema, unspecified: Secondary | ICD-10-CM | POA: Diagnosis not present

## 2012-11-09 DIAGNOSIS — I10 Essential (primary) hypertension: Secondary | ICD-10-CM | POA: Diagnosis not present

## 2012-11-12 DIAGNOSIS — N3941 Urge incontinence: Secondary | ICD-10-CM | POA: Diagnosis not present

## 2012-11-16 DIAGNOSIS — I10 Essential (primary) hypertension: Secondary | ICD-10-CM | POA: Diagnosis not present

## 2012-11-16 DIAGNOSIS — E7800 Pure hypercholesterolemia, unspecified: Secondary | ICD-10-CM | POA: Diagnosis not present

## 2012-11-16 DIAGNOSIS — F329 Major depressive disorder, single episode, unspecified: Secondary | ICD-10-CM | POA: Diagnosis not present

## 2012-11-16 DIAGNOSIS — K219 Gastro-esophageal reflux disease without esophagitis: Secondary | ICD-10-CM | POA: Diagnosis not present

## 2012-11-19 DIAGNOSIS — N3941 Urge incontinence: Secondary | ICD-10-CM | POA: Diagnosis not present

## 2012-11-26 DIAGNOSIS — N3941 Urge incontinence: Secondary | ICD-10-CM | POA: Diagnosis not present

## 2012-12-05 DIAGNOSIS — Z79899 Other long term (current) drug therapy: Secondary | ICD-10-CM | POA: Diagnosis not present

## 2012-12-05 DIAGNOSIS — R51 Headache: Secondary | ICD-10-CM | POA: Diagnosis not present

## 2012-12-05 DIAGNOSIS — M129 Arthropathy, unspecified: Secondary | ICD-10-CM | POA: Diagnosis not present

## 2012-12-05 DIAGNOSIS — T391X1A Poisoning by 4-Aminophenol derivatives, accidental (unintentional), initial encounter: Secondary | ICD-10-CM | POA: Diagnosis not present

## 2012-12-05 DIAGNOSIS — I10 Essential (primary) hypertension: Secondary | ICD-10-CM | POA: Diagnosis not present

## 2012-12-08 ENCOUNTER — Encounter: Payer: Self-pay | Admitting: Gastroenterology

## 2012-12-08 ENCOUNTER — Ambulatory Visit (INDEPENDENT_AMBULATORY_CARE_PROVIDER_SITE_OTHER): Payer: Medicare Other | Admitting: Gastroenterology

## 2012-12-08 VITALS — BP 131/83 | HR 80 | Temp 97.4°F | Ht 64.0 in | Wt 190.8 lb

## 2012-12-08 DIAGNOSIS — Z1211 Encounter for screening for malignant neoplasm of colon: Secondary | ICD-10-CM

## 2012-12-08 MED ORDER — PEG 3350-KCL-NA BICARB-NACL 420 G PO SOLR: 4000.0000 mL | ORAL | Status: DC

## 2012-12-08 NOTE — Patient Instructions (Addendum)
We have scheduled you for a colonoscopy with Dr. Fields in the near future.  Further recommendations to follow!   

## 2012-12-08 NOTE — Progress Notes (Signed)
Primary Care Physician:  Selinda Flavin, MD Primary Gastroenterologist:  Dr. Darrick Penna   Chief Complaint  Patient presents with  . Colonoscopy    HPI:   67 year old female presents today for a visit prior to screening colonoscopy. Last colonoscopy 11 years ago by Dr. Dione Booze. No polyps per her  report. No abdominal pain, significant change in bowel habits. No rectal bleeding. Occasional flare of hemorrhoids with discomfort. No current issues. No OTC agents.   Prilosec daily for GERD with good control. No dysphagia. Notes weight loss of about 30 lbs since November, INTENTIONALLY. Used to drink sweet tea a lot, but she has significantly reduced this. Paying attention to what she eats. No exercise.   Past Medical History  Diagnosis Date  . Hypertension   . Hyperlipidemia   . Intrinsic urethral sphincter deficiency   . Non-Hodgkin lymphoma DX  JULY 2012---  SMALL AND SLOW GROWING--  NO TX      FOLLOWED BY [facility]  . Right thyroid nodule ACCIDENTAL FINDING FROM PET SCAN  2012  . OSA on CPAP   . Arthritis KNEES AND HIPS  . GERD (gastroesophageal reflux disease)   . Frequency of urination   . Urgency of urination   . Incontinence of urine   . Nocturia   . UTI (lower urinary tract infection)   . Insomnia   . Pneumonia   . Bronchitis   . Migraine     hx of havent had one in 15 years  . Mental disorder   . Depression   . Hypertension   . Hyperlipidemia   . Sleep apnea   . Non Hodgkin's lymphoma     Past Surgical History  Procedure Laterality Date  . Cholecystectomy  1989  . Tonsillectomy  childhood  . Dilation and curettage of uterus  2009    hysteroscopy/ablasion  . Anterior and posterior repair  1994  . Bunionectomy  JAN  2012  & SEPT 2010  . Left retroperitoneal lymph  node bx  12-25-2010  . Cystoscopy with injection  10/28/2011    Procedure: CYSTOSCOPY WITH INJECTION;  Surgeon: Anner Crete, MD;  Location: [facility];  Service:  Urology;  Laterality: N/A;  MACRO PLASTIQUE  . Colonoscopy    . Total knee arthroplasty  04/26/2012    Procedure: TOTAL KNEE ARTHROPLASTY;  Surgeon: Nilda Simmer, MD;  Location: [facility] OR;  Service: Orthopedics;  Laterality: Left;    Current Outpatient Prescriptions  Medication Sig Dispense Refill  . Cholecalciferol (VITAMIN D-3) 1000 UNITS CAPS Take 1,000 Units by mouth daily.      Marland Kitchen diltiazem (TIAZAC) 300 MG 24 hr capsule Take 300 mg by mouth every morning.      . Magnesium 250 MG TABS Take 250 mg by mouth daily.      Marland Kitchen omeprazole (PRILOSEC) 20 MG capsule Take 20 mg by mouth every morning.      . potassium chloride SA (K-DUR,KLOR-CON) 20 MEQ tablet Take 20 mEq by mouth daily.      . sertraline (ZOLOFT) 50 MG tablet Take 50 mg by mouth every morning.      . simvastatin (ZOCOR) 40 MG tablet Take 40 mg by mouth every morning.       No current facility-administered medications for this visit.    Allergies as of 12/08/2012  . (No Known Allergies)    Family History  Problem Relation Age of Onset  . Heart disease Mother   . Alzheimer's disease  Mother   . Hypertension Mother   . Cancer Mother     uterus  . Heart attack Father   . Gout Father   . Hypertension Father   . Leukemia Brother     History   Social History  . Marital Status: Married    Spouse Name: N/A    Number of Children: 3  . Years of Education: N/A   Occupational History  . volunteer [employer]   Social History Main Topics  . Smoking status: Never Smoker   . Smokeless tobacco: Never Used  . Alcohol Use: No  . Drug Use: No  . Sexually Active: Not on file   Other Topics Concern  . Not on file   Social History Narrative  . No narrative on file    Review of Systems: Gen: see HPI CV: Denies chest pain, heart palpitations, peripheral edema, syncope.  Resp: Denies shortness of breath at rest or with exertion. Denies wheezing or cough.  GI: see HPI GU : bladder spasms  MS: Denies joint pain,  muscle weakness, cramps, or limitation of movement.  Derm: Denies rash, itching, dry skin Psych: Denies depression, anxiety, memory loss, and confusion Heme: Denies bruising, bleeding, and enlarged lymph nodes.  Physical Exam: BP 131/83  Pulse 80  Temp(Src) 97.4 F (36.3 C) (Oral)  Ht 5\' 4"  (1.626 m)  Wt 190 lb 12.8 oz (86.546 kg)  BMI 32.73 kg/m2 General:   Alert and oriented. Pleasant and cooperative. Well-nourished and well-developed.  Head:  Normocephalic and atraumatic. Eyes:  Without icterus, sclera clear and conjunctiva pink.  Ears:  Normal auditory acuity. Nose:  No deformity, discharge,  or lesions. Mouth:  No deformity or lesions, oral mucosa pink.  Neck:  Supple, without mass or thyromegaly. Lungs:  Clear to auscultation bilaterally. No wheezes, rales, or rhonchi. No distress.  Heart:  S1, S2 present without murmurs appreciated.  Abdomen:  +BS, soft, non-tender and non-distended. No HSM noted. No guarding or rebound. No masses appreciated.  Rectal:  Deferred  Msk:  Symmetrical without gross deformities. Normal posture. Extremities:  Without clubbing or edema. Neurologic:  Alert and  oriented x4;  grossly normal neurologically. Skin:  Intact without significant lesions or rashes. Cervical Nodes:  No significant cervical adenopathy. Psych:  Alert and cooperative. Normal mood and affect.

## 2012-12-10 DIAGNOSIS — Z1211 Encounter for screening for malignant neoplasm of colon: Secondary | ICD-10-CM | POA: Insufficient documentation

## 2012-12-10 NOTE — Assessment & Plan Note (Signed)
67 year old female with need for updated screening colonoscopy, without any significant lower GI symptoms. Last procedures at least 11 years ago by Dr. Dione Booze. She does note occasional hemorrhoid flares with discomfort, but she denies any issues at time of visit. She is interested in having hemorrhoid banding as appropriate at time of colonoscopy. I discussed the outpatient banding procedure with her, but she would like the banding procedure during the colonoscopy if possible.    Proceed with colonoscopy with Dr. Darrick Penna in the near future. The risks, benefits, and alternatives have been discussed in detail with the patient. They state understanding and desire to proceed.  Possible banding at time of procedure as appropriate

## 2012-12-13 ENCOUNTER — Encounter (HOSPITAL_COMMUNITY): Payer: Self-pay | Admitting: Pharmacy Technician

## 2012-12-13 NOTE — Progress Notes (Signed)
Cc PCP 

## 2012-12-20 ENCOUNTER — Encounter (HOSPITAL_COMMUNITY)
Admission: RE | Disposition: A | Discharge status: Home / Self Care | Payer: Self-pay | Source: Ambulatory Visit | Attending: Gastroenterology

## 2012-12-20 ENCOUNTER — Ambulatory Visit (HOSPITAL_COMMUNITY)
Admission: RE | Admit: 2012-12-20 | Discharge: 2012-12-20 | Disposition: A | Discharge status: Home / Self Care | Payer: Medicare Other | Source: Ambulatory Visit | Attending: Gastroenterology | Admitting: Gastroenterology

## 2012-12-20 ENCOUNTER — Encounter (HOSPITAL_COMMUNITY): Payer: Self-pay | Admitting: *Deleted

## 2012-12-20 DIAGNOSIS — Z1211 Encounter for screening for malignant neoplasm of colon: Secondary | ICD-10-CM

## 2012-12-20 DIAGNOSIS — I10 Essential (primary) hypertension: Secondary | ICD-10-CM | POA: Insufficient documentation

## 2012-12-20 DIAGNOSIS — K5730 Diverticulosis of large intestine without perforation or abscess without bleeding: Secondary | ICD-10-CM

## 2012-12-20 DIAGNOSIS — D126 Benign neoplasm of colon, unspecified: Secondary | ICD-10-CM | POA: Diagnosis not present

## 2012-12-20 HISTORY — PX: COLONOSCOPY: SHX5424

## 2012-12-20 SURGERY — COLONOSCOPY
Anesthesia: Moderate Sedation

## 2012-12-20 MED ORDER — MEPERIDINE HCL 100 MG/ML IJ SOLN
INTRAMUSCULAR | Status: DC | PRN
  Administered 2012-12-20 (×2): 25 mg via INTRAVENOUS

## 2012-12-20 MED ORDER — MIDAZOLAM HCL 5 MG/5ML IJ SOLN
INTRAMUSCULAR | Status: DC | PRN
  Administered 2012-12-20 (×2): 2 mg via INTRAVENOUS

## 2012-12-20 MED ORDER — MIDAZOLAM HCL 5 MG/5ML IJ SOLN
INTRAMUSCULAR | Status: AC
  Filled 2012-12-20: qty 10

## 2012-12-20 MED ORDER — SODIUM CHLORIDE 0.9 % IV SOLN
INTRAVENOUS | Status: DC
  Administered 2012-12-20: 12:00:00 via INTRAVENOUS

## 2012-12-20 MED ORDER — MEPERIDINE HCL 100 MG/ML IJ SOLN
INTRAMUSCULAR | Status: AC
  Filled 2012-12-20: qty 1

## 2012-12-20 NOTE — Op Note (Signed)
[facility] [address] [place], [address]   COLONOSCOPY PROCEDURE REPORT  PATIENT: Hailey [name], Hailey [name]  MR#: [id number] BIRTHDATE: [birth year] , 67  yrs. old GENDER: Female ENDOSCOPIST: Jonette Eva, MD REFERRED AO:ZHYQM Howard, M.D. PROCEDURE DATE:  12/20/2012 PROCEDURE:   Colonoscopy with cold biopsy polypectomy INDICATIONS:Average risk patient for colon cancer.  LAST TCS 11 YRS AGO. MEDICATIONS: Demerol 50 mg IV and Versed 4 mg IV  DESCRIPTION OF PROCEDURE:    Physical exam was performed.  Informed consent was obtained from the patient after explaining the benefits, risks, and alternatives to procedure.  The patient was connected to monitor and placed in left lateral position. Continuous oxygen was provided by nasal cannula and IV medicine administered through an indwelling cannula.  After administration of sedation and rectal exam, the patients rectum was intubated and the EC-[id number] ([id number])  colonoscope was advanced under direct visualization to the cecum.  The scope was removed slowly by carefully examining the color, texture, anatomy, and integrity mucosa on the way out.  The patient was recovered in endoscopy and discharged home in satisfactory condition.    COLON FINDINGS: A sessile polyp measuring 4 mm in size was found in the transverse colon.  A polypectomy was performed with cold forceps.  , There was moderate diverticulosis noted in the descending colon and sigmoid colon with associated muscular hypertrophy and angulation.  , The colon was otherwise normal. There was no inflammation, or cancers unless previously stated.  , and Moderate sized external hemorrhoids were found.   UNABLE TO APPRECIATE INTERNAL HEMORRHOIDS.  PREP QUALITY: good     CECAL W/D TIME: 18 minutes  COMPLICATIONS: None  ENDOSCOPIC IMPRESSION: 1.   ONE COLON POLY REMOVED 2.   Moderate diverticulosis in the descending colon and sigmoid colon 3.   Moderate sized external  hemorrhoids   RECOMMENDATIONS: AWAIT BIOPSY HIGH FIBER DIET TCS IN 10 YEARS IF BENEFITS OUTWEIGH THE RISKS       _______________________________ Rosalie DoctorJonette Eva, MD 12/20/2012 1:27 PM

## 2012-12-20 NOTE — H&P (Signed)
Primary Care Physician:  Selinda Flavin, MD Primary Gastroenterologist:  Dr. Darrick Penna  Pre-Procedure History & Physical: HPI:  Hailey [name] is a 67 y.o. female here for COLON CANCER SCREENING.   Past Medical History  Diagnosis Date  . Hypertension   . Hyperlipidemia   . Intrinsic urethral sphincter deficiency   . Non-Hodgkin lymphoma DX  JULY 2012---  SMALL AND SLOW GROWING--  NO TX      FOLLOWED BY [facility]  . Right thyroid nodule ACCIDENTAL FINDING FROM PET SCAN  2012  . OSA on CPAP   . Arthritis KNEES AND HIPS  . GERD (gastroesophageal reflux disease)   . Frequency of urination   . Urgency of urination   . Incontinence of urine   . Nocturia   . UTI (lower urinary tract infection)   . Insomnia   . Pneumonia   . Bronchitis   . Migraine     hx of havent had one in 15 years  . Depression   . Hypertension   . Hyperlipidemia   . Sleep apnea   . Non Hodgkin's lymphoma     Past Surgical History  Procedure Laterality Date  . Cholecystectomy  1989  . Tonsillectomy  childhood  . Dilation and curettage of uterus  2009    hysteroscopy/ablasion  . Anterior and posterior repair  1994    bladder  . Bunionectomy  JAN  2012  & SEPT 2010  . Left retroperitoneal lymph  node bx  12-25-2010  . Cystoscopy with injection  10/28/2011    Procedure: CYSTOSCOPY WITH INJECTION;  Surgeon: Anner Crete, MD;  Location: [facility];  Service: Urology;  Laterality: N/A;  MACRO PLASTIQUE  . Colonoscopy    . Total knee arthroplasty  04/26/2012    Procedure: TOTAL KNEE ARTHROPLASTY;  Surgeon: Nilda Simmer, MD;  Location: [facility] OR;  Service: Orthopedics;  Laterality: Left;    Prior to Admission medications   Medication Sig Start Date End Date Taking? Authorizing Provider  Cholecalciferol (VITAMIN D-3) 1000 UNITS CAPS Take 1,000 Units by mouth daily.   Yes Historical Provider, MD  diltiazem (TIAZAC) 300 MG 24 hr capsule Take 300 mg by mouth every morning.   Yes  Historical Provider, MD  Magnesium 250 MG TABS Take 250 mg by mouth daily.   Yes Historical Provider, MD  omeprazole (PRILOSEC) 20 MG capsule Take 20 mg by mouth every morning.   Yes Historical Provider, MD  polyethylene glycol-electrolytes (TRILYTE) 420 G solution Take 4,000 mLs by mouth as directed. 12/08/12  Yes West Bali, MD  potassium chloride SA (K-DUR,KLOR-CON) 20 MEQ tablet Take 20 mEq by mouth daily.   Yes Historical Provider, MD  sertraline (ZOLOFT) 50 MG tablet Take 50 mg by mouth every morning.   Yes Historical Provider, MD  simvastatin (ZOCOR) 40 MG tablet Take 40 mg by mouth every morning.   Yes Historical Provider, MD    Allergies as of 12/08/2012  . (No Known Allergies)    Family History  Problem Relation Age of Onset  . Heart disease Mother   . Alzheimer's disease Mother   . Hypertension Mother   . Cancer Mother     uterus  . Heart attack Father   . Gout Father   . Hypertension Father   . Leukemia Brother   . Colon cancer Neg Hx     History   Social History  . Marital Status: Married    Spouse Name: N/A  Number of Children: 3  . Years of Education: N/A   Occupational History  . volunteer [employer]   Social History Main Topics  . Smoking status: Never Smoker   . Smokeless tobacco: Never Used  . Alcohol Use: No  . Drug Use: No  . Sexually Active: Not on file   Other Topics Concern  . Not on file   Social History Narrative  . No narrative on file    Review of Systems: See HPI, otherwise negative ROS   Physical Exam: BP 135/75  Temp(Src) 98.3 F (36.8 C) (Oral)  Resp 13  Ht 5\' 4"  (1.626 m)  Wt 188 lb 8 oz (85.503 kg)  BMI 32.34 kg/m2  SpO2 97% General:   Alert,  pleasant and cooperative in NAD Head:  Normocephalic and atraumatic. Neck:  Supple; Lungs:  Clear throughout to auscultation.    Heart:  Regular rate and rhythm. Abdomen:  Soft, nontender and nondistended. Normal bowel sounds, without guarding, and without  rebound.   Neurologic:  Alert and  oriented x4;  grossly normal neurologically.  Impression/Plan:     SCREENING  Plan:  1. TCS-?hemorrhoid banding TODAY

## 2012-12-22 ENCOUNTER — Telehealth: Payer: Self-pay | Admitting: Gastroenterology

## 2012-12-22 ENCOUNTER — Encounter (HOSPITAL_COMMUNITY): Payer: Self-pay | Admitting: Gastroenterology

## 2012-12-22 DIAGNOSIS — N3946 Mixed incontinence: Secondary | ICD-10-CM | POA: Diagnosis not present

## 2012-12-22 NOTE — Telephone Encounter (Signed)
Please call pt. She had a simple adenoma removed from her colon. FOLLOW A High fiber diet. TCS in 10-15 years IF THE BENEFITS OUTWEIGH THE RISKS.

## 2012-12-23 NOTE — Telephone Encounter (Signed)
CC PCP 

## 2012-12-23 NOTE — Telephone Encounter (Signed)
Called and informed pt.  

## 2013-01-12 DIAGNOSIS — N3941 Urge incontinence: Secondary | ICD-10-CM | POA: Diagnosis not present

## 2013-01-23 ENCOUNTER — Encounter (HOSPITAL_COMMUNITY): Payer: Self-pay | Admitting: Gastroenterology

## 2013-02-02 DIAGNOSIS — N3941 Urge incontinence: Secondary | ICD-10-CM | POA: Diagnosis not present

## 2013-02-08 DIAGNOSIS — R5381 Other malaise: Secondary | ICD-10-CM | POA: Diagnosis not present

## 2013-02-08 DIAGNOSIS — M129 Arthropathy, unspecified: Secondary | ICD-10-CM | POA: Diagnosis not present

## 2013-02-08 DIAGNOSIS — I10 Essential (primary) hypertension: Secondary | ICD-10-CM | POA: Diagnosis not present

## 2013-02-08 DIAGNOSIS — F329 Major depressive disorder, single episode, unspecified: Secondary | ICD-10-CM | POA: Diagnosis not present

## 2013-02-08 DIAGNOSIS — K219 Gastro-esophageal reflux disease without esophagitis: Secondary | ICD-10-CM | POA: Diagnosis not present

## 2013-02-08 DIAGNOSIS — E538 Deficiency of other specified B group vitamins: Secondary | ICD-10-CM | POA: Diagnosis not present

## 2013-02-08 DIAGNOSIS — E7800 Pure hypercholesterolemia, unspecified: Secondary | ICD-10-CM | POA: Diagnosis not present

## 2013-02-14 DIAGNOSIS — I10 Essential (primary) hypertension: Secondary | ICD-10-CM | POA: Diagnosis not present

## 2013-02-14 DIAGNOSIS — F329 Major depressive disorder, single episode, unspecified: Secondary | ICD-10-CM | POA: Diagnosis not present

## 2013-02-14 DIAGNOSIS — E7800 Pure hypercholesterolemia, unspecified: Secondary | ICD-10-CM | POA: Diagnosis not present

## 2013-02-23 DIAGNOSIS — N3941 Urge incontinence: Secondary | ICD-10-CM | POA: Diagnosis not present

## 2013-03-16 DIAGNOSIS — N3941 Urge incontinence: Secondary | ICD-10-CM | POA: Diagnosis not present

## 2013-04-06 DIAGNOSIS — N3941 Urge incontinence: Secondary | ICD-10-CM | POA: Diagnosis not present

## 2013-04-19 DIAGNOSIS — M76899 Other specified enthesopathies of unspecified lower limb, excluding foot: Secondary | ICD-10-CM | POA: Diagnosis not present

## 2013-04-19 DIAGNOSIS — M25569 Pain in unspecified knee: Secondary | ICD-10-CM | POA: Diagnosis not present

## 2013-04-27 DIAGNOSIS — N3941 Urge incontinence: Secondary | ICD-10-CM | POA: Diagnosis not present

## 2013-04-27 DIAGNOSIS — R300 Dysuria: Secondary | ICD-10-CM | POA: Diagnosis not present

## 2013-04-28 DIAGNOSIS — H2510 Age-related nuclear cataract, unspecified eye: Secondary | ICD-10-CM | POA: Diagnosis not present

## 2013-04-28 DIAGNOSIS — H348392 Tributary (branch) retinal vein occlusion, unspecified eye, stable: Secondary | ICD-10-CM | POA: Diagnosis not present

## 2013-05-11 DIAGNOSIS — H35049 Retinal micro-aneurysms, unspecified, unspecified eye: Secondary | ICD-10-CM | POA: Diagnosis not present

## 2013-05-11 DIAGNOSIS — H348392 Tributary (branch) retinal vein occlusion, unspecified eye, stable: Secondary | ICD-10-CM | POA: Diagnosis not present

## 2013-05-11 DIAGNOSIS — H2510 Age-related nuclear cataract, unspecified eye: Secondary | ICD-10-CM | POA: Diagnosis not present

## 2013-05-11 DIAGNOSIS — H43819 Vitreous degeneration, unspecified eye: Secondary | ICD-10-CM | POA: Diagnosis not present

## 2013-05-16 DIAGNOSIS — I10 Essential (primary) hypertension: Secondary | ICD-10-CM | POA: Diagnosis not present

## 2013-05-16 DIAGNOSIS — E7800 Pure hypercholesterolemia, unspecified: Secondary | ICD-10-CM | POA: Diagnosis not present

## 2013-05-18 DIAGNOSIS — N3941 Urge incontinence: Secondary | ICD-10-CM | POA: Diagnosis not present

## 2013-05-23 DIAGNOSIS — H348392 Tributary (branch) retinal vein occlusion, unspecified eye, stable: Secondary | ICD-10-CM | POA: Diagnosis not present

## 2013-06-08 DIAGNOSIS — N3941 Urge incontinence: Secondary | ICD-10-CM | POA: Diagnosis not present

## 2013-06-28 DIAGNOSIS — H348392 Tributary (branch) retinal vein occlusion, unspecified eye, stable: Secondary | ICD-10-CM | POA: Diagnosis not present

## 2013-06-28 DIAGNOSIS — H35049 Retinal micro-aneurysms, unspecified, unspecified eye: Secondary | ICD-10-CM | POA: Diagnosis not present

## 2013-06-28 DIAGNOSIS — H3581 Retinal edema: Secondary | ICD-10-CM | POA: Diagnosis not present

## 2013-06-29 DIAGNOSIS — N3946 Mixed incontinence: Secondary | ICD-10-CM | POA: Diagnosis not present

## 2013-06-29 DIAGNOSIS — N3941 Urge incontinence: Secondary | ICD-10-CM | POA: Diagnosis not present

## 2013-06-29 DIAGNOSIS — R351 Nocturia: Secondary | ICD-10-CM | POA: Diagnosis not present

## 2013-07-06 DIAGNOSIS — H348392 Tributary (branch) retinal vein occlusion, unspecified eye, stable: Secondary | ICD-10-CM | POA: Diagnosis not present

## 2013-07-18 NOTE — Progress Notes (Signed)
REVIEWED.  TCS JUL 2014 SIMPLE ADENOMA(1)

## 2013-07-20 DIAGNOSIS — N3941 Urge incontinence: Secondary | ICD-10-CM | POA: Diagnosis not present

## 2013-07-25 DIAGNOSIS — C8589 Other specified types of non-Hodgkin lymphoma, extranodal and solid organ sites: Secondary | ICD-10-CM | POA: Diagnosis not present

## 2013-07-25 DIAGNOSIS — R911 Solitary pulmonary nodule: Secondary | ICD-10-CM | POA: Diagnosis not present

## 2013-07-25 DIAGNOSIS — R599 Enlarged lymph nodes, unspecified: Secondary | ICD-10-CM | POA: Diagnosis not present

## 2013-08-03 DIAGNOSIS — H35049 Retinal micro-aneurysms, unspecified, unspecified eye: Secondary | ICD-10-CM | POA: Diagnosis not present

## 2013-08-03 DIAGNOSIS — H3581 Retinal edema: Secondary | ICD-10-CM | POA: Diagnosis not present

## 2013-08-03 DIAGNOSIS — H43819 Vitreous degeneration, unspecified eye: Secondary | ICD-10-CM | POA: Diagnosis not present

## 2013-08-03 DIAGNOSIS — H348392 Tributary (branch) retinal vein occlusion, unspecified eye, stable: Secondary | ICD-10-CM | POA: Diagnosis not present

## 2013-08-10 DIAGNOSIS — N3941 Urge incontinence: Secondary | ICD-10-CM | POA: Diagnosis not present

## 2013-08-10 DIAGNOSIS — I10 Essential (primary) hypertension: Secondary | ICD-10-CM | POA: Diagnosis not present

## 2013-08-10 DIAGNOSIS — M129 Arthropathy, unspecified: Secondary | ICD-10-CM | POA: Diagnosis not present

## 2013-08-10 DIAGNOSIS — G4733 Obstructive sleep apnea (adult) (pediatric): Secondary | ICD-10-CM | POA: Diagnosis not present

## 2013-08-10 DIAGNOSIS — F329 Major depressive disorder, single episode, unspecified: Secondary | ICD-10-CM | POA: Diagnosis not present

## 2013-08-10 DIAGNOSIS — K219 Gastro-esophageal reflux disease without esophagitis: Secondary | ICD-10-CM | POA: Diagnosis not present

## 2013-08-10 DIAGNOSIS — F3289 Other specified depressive episodes: Secondary | ICD-10-CM | POA: Diagnosis not present

## 2013-08-10 DIAGNOSIS — C8589 Other specified types of non-Hodgkin lymphoma, extranodal and solid organ sites: Secondary | ICD-10-CM | POA: Diagnosis not present

## 2013-08-10 DIAGNOSIS — E7800 Pure hypercholesterolemia, unspecified: Secondary | ICD-10-CM | POA: Diagnosis not present

## 2013-08-31 DIAGNOSIS — H35049 Retinal micro-aneurysms, unspecified, unspecified eye: Secondary | ICD-10-CM | POA: Diagnosis not present

## 2013-08-31 DIAGNOSIS — N3941 Urge incontinence: Secondary | ICD-10-CM | POA: Diagnosis not present

## 2013-08-31 DIAGNOSIS — H348392 Tributary (branch) retinal vein occlusion, unspecified eye, stable: Secondary | ICD-10-CM | POA: Diagnosis not present

## 2013-08-31 DIAGNOSIS — H3581 Retinal edema: Secondary | ICD-10-CM | POA: Diagnosis not present

## 2013-09-05 DIAGNOSIS — F329 Major depressive disorder, single episode, unspecified: Secondary | ICD-10-CM | POA: Diagnosis not present

## 2013-09-05 DIAGNOSIS — E7800 Pure hypercholesterolemia, unspecified: Secondary | ICD-10-CM | POA: Diagnosis not present

## 2013-09-05 DIAGNOSIS — B009 Herpesviral infection, unspecified: Secondary | ICD-10-CM | POA: Diagnosis not present

## 2013-09-05 DIAGNOSIS — F3289 Other specified depressive episodes: Secondary | ICD-10-CM | POA: Diagnosis not present

## 2013-09-05 DIAGNOSIS — IMO0001 Reserved for inherently not codable concepts without codable children: Secondary | ICD-10-CM | POA: Diagnosis not present

## 2013-09-07 ENCOUNTER — Ambulatory Visit: Payer: Medicare Other | Admitting: Pulmonary Disease

## 2013-09-21 DIAGNOSIS — N3941 Urge incontinence: Secondary | ICD-10-CM | POA: Diagnosis not present

## 2013-09-23 ENCOUNTER — Encounter: Payer: Self-pay | Admitting: Pulmonary Disease

## 2013-09-23 ENCOUNTER — Ambulatory Visit (INDEPENDENT_AMBULATORY_CARE_PROVIDER_SITE_OTHER): Payer: Medicare Other | Admitting: Pulmonary Disease

## 2013-09-23 VITALS — BP 126/72 | HR 63 | Temp 98.2°F | Ht 64.5 in | Wt 198.4 lb

## 2013-09-23 DIAGNOSIS — G4733 Obstructive sleep apnea (adult) (pediatric): Secondary | ICD-10-CM | POA: Diagnosis not present

## 2013-09-23 NOTE — Assessment & Plan Note (Signed)
The patient has been trying to wear CPAP compliantly, but has been having issues with mouth dryness despite using a full face mask and the heated humidifier. She believes this has led to dental caries. I discussed with her trying a nasal mask or nasal pillows, and we can add a chin strap if she is having mouth opening. She is willing to give this a try. I've also discussed with her keeping up with her mask changes and supplies.

## 2013-09-23 NOTE — Progress Notes (Signed)
   Subjective:    Patient ID: Hailey [name], female    DOB: 02-22-1946, 68 y.o.   MRN: [id number]  HPI The patient comes in today for followup of her obstructive sleep apnea. She is been trying to wear CPAP compliantly, but has been having issues with severe mouth dryness. She uses a full face mask along with heated humidification, but doesn't feel this is adequate. She is concerned this is leading to dental caries. She is willing to try a different type of mask.   Review of Systems  Constitutional: Negative for fever and unexpected weight change.  HENT: Positive for dental problem. Negative for congestion, ear pain, nosebleeds, postnasal drip, rhinorrhea, sinus pressure, sneezing, sore throat and trouble swallowing.        Dry mouth  Eyes: Negative for redness and itching.  Respiratory: Negative for cough, chest tightness, shortness of breath and wheezing.   Cardiovascular: Negative for palpitations and leg swelling.  Gastrointestinal: Negative for nausea and vomiting.  Genitourinary: Negative for dysuria.  Musculoskeletal: Negative for joint swelling.  Skin: Negative for rash.  Neurological: Negative for headaches.  Hematological: Does not bruise/bleed easily.  Psychiatric/Behavioral: Negative for dysphoric mood. The patient is not nervous/anxious.        Objective:   Physical Exam Overweight female in no acute distress Nose without purulence or discharge noted No skin breakdown or pressure necrosis from the CPAP mask Neck without lymphadenopathy or thyromegaly Lower extremities with mild edema, no cyanosis Alert and oriented, does not appear to be sleepy, moves all 4 extremities.       Assessment & Plan:

## 2013-09-23 NOTE — Patient Instructions (Signed)
Will try either a nasal mask or nasal pillows.  Add a chin strap if you have issues with mouth opening. Keep working on weight loss followup with me again in one year, but please call if you are having ongoing cpap issues.

## 2013-09-28 DIAGNOSIS — H348392 Tributary (branch) retinal vein occlusion, unspecified eye, stable: Secondary | ICD-10-CM | POA: Diagnosis not present

## 2013-09-28 DIAGNOSIS — H43819 Vitreous degeneration, unspecified eye: Secondary | ICD-10-CM | POA: Diagnosis not present

## 2013-09-28 DIAGNOSIS — H2510 Age-related nuclear cataract, unspecified eye: Secondary | ICD-10-CM | POA: Diagnosis not present

## 2013-09-28 DIAGNOSIS — H35049 Retinal micro-aneurysms, unspecified, unspecified eye: Secondary | ICD-10-CM | POA: Diagnosis not present

## 2013-09-30 ENCOUNTER — Telehealth: Payer: Self-pay | Admitting: Pulmonary Disease

## 2013-09-30 NOTE — Telephone Encounter (Signed)
Provider Comments 09/23/2013 4:33 PM Kathee Delton        Summary    Provider Comments         Note    [facility]    Needs to try either nasal mask or pillows    Would keep order on file in the event she needs a chin strap  --  Called spoke with Iris from [facility]. She reports Angie has stepped out and will have to check with her since Iris does not have order.  She will call me back Pt aware we are looking into this and will call her once we know something

## 2013-10-03 NOTE — Telephone Encounter (Signed)
Spoke with the pt and she has not heard from [facility] yet  I called [facility] and spoke with rep who states that they are not even able to pull this pt up in their system  She states that our order that we faxed on 09/23/13 was never received  I advised the pt that I will have our [facility] resend the order and make sure that they receive  She verbalized understanding

## 2013-10-03 NOTE — Telephone Encounter (Signed)
ATC [facility], they are in morning meeting. I will forward to triage so we can try again later. Drexel Bing, CMA

## 2013-10-03 NOTE — Telephone Encounter (Signed)
I called [facility] and spoke with Ashley-she took the information down about the patient and order that was sent on 09/23/13. She will have someone at [facility] contact our office.

## 2013-10-03 NOTE — Telephone Encounter (Signed)
Patient calling back requesting status of order below.  [phone]

## 2013-10-11 DIAGNOSIS — R1084 Generalized abdominal pain: Secondary | ICD-10-CM | POA: Diagnosis not present

## 2013-10-17 DIAGNOSIS — N281 Cyst of kidney, acquired: Secondary | ICD-10-CM | POA: Diagnosis not present

## 2013-10-17 DIAGNOSIS — R935 Abnormal findings on diagnostic imaging of other abdominal regions, including retroperitoneum: Secondary | ICD-10-CM | POA: Diagnosis not present

## 2013-10-17 DIAGNOSIS — K5732 Diverticulitis of large intestine without perforation or abscess without bleeding: Secondary | ICD-10-CM | POA: Diagnosis not present

## 2013-10-17 DIAGNOSIS — K5730 Diverticulosis of large intestine without perforation or abscess without bleeding: Secondary | ICD-10-CM | POA: Diagnosis not present

## 2013-10-17 DIAGNOSIS — Z0189 Encounter for other specified special examinations: Secondary | ICD-10-CM | POA: Diagnosis not present

## 2013-10-18 DIAGNOSIS — E876 Hypokalemia: Secondary | ICD-10-CM | POA: Diagnosis not present

## 2013-10-18 DIAGNOSIS — C8589 Other specified types of non-Hodgkin lymphoma, extranodal and solid organ sites: Secondary | ICD-10-CM | POA: Diagnosis not present

## 2013-10-19 DIAGNOSIS — N3941 Urge incontinence: Secondary | ICD-10-CM | POA: Diagnosis not present

## 2013-10-25 DIAGNOSIS — R599 Enlarged lymph nodes, unspecified: Secondary | ICD-10-CM | POA: Diagnosis not present

## 2013-10-25 DIAGNOSIS — C8589 Other specified types of non-Hodgkin lymphoma, extranodal and solid organ sites: Secondary | ICD-10-CM | POA: Diagnosis not present

## 2013-10-26 DIAGNOSIS — H35049 Retinal micro-aneurysms, unspecified, unspecified eye: Secondary | ICD-10-CM | POA: Diagnosis not present

## 2013-10-26 DIAGNOSIS — E11329 Type 2 diabetes mellitus with mild nonproliferative diabetic retinopathy without macular edema: Secondary | ICD-10-CM | POA: Diagnosis not present

## 2013-10-26 DIAGNOSIS — H3581 Retinal edema: Secondary | ICD-10-CM | POA: Diagnosis not present

## 2013-10-26 DIAGNOSIS — H348392 Tributary (branch) retinal vein occlusion, unspecified eye, stable: Secondary | ICD-10-CM | POA: Diagnosis not present

## 2013-10-27 DIAGNOSIS — R109 Unspecified abdominal pain: Secondary | ICD-10-CM | POA: Diagnosis not present

## 2013-11-01 DIAGNOSIS — R109 Unspecified abdominal pain: Secondary | ICD-10-CM | POA: Diagnosis not present

## 2013-11-02 DIAGNOSIS — M129 Arthropathy, unspecified: Secondary | ICD-10-CM | POA: Diagnosis not present

## 2013-11-02 DIAGNOSIS — Z8489 Family history of other specified conditions: Secondary | ICD-10-CM | POA: Diagnosis not present

## 2013-11-02 DIAGNOSIS — F411 Generalized anxiety disorder: Secondary | ICD-10-CM | POA: Diagnosis not present

## 2013-11-02 DIAGNOSIS — R109 Unspecified abdominal pain: Secondary | ICD-10-CM | POA: Diagnosis not present

## 2013-11-02 DIAGNOSIS — Z79899 Other long term (current) drug therapy: Secondary | ICD-10-CM | POA: Diagnosis not present

## 2013-11-02 DIAGNOSIS — G4730 Sleep apnea, unspecified: Secondary | ICD-10-CM | POA: Diagnosis not present

## 2013-11-02 DIAGNOSIS — E7800 Pure hypercholesterolemia, unspecified: Secondary | ICD-10-CM | POA: Diagnosis not present

## 2013-11-02 DIAGNOSIS — Z833 Family history of diabetes mellitus: Secondary | ICD-10-CM | POA: Diagnosis not present

## 2013-11-02 DIAGNOSIS — E119 Type 2 diabetes mellitus without complications: Secondary | ICD-10-CM | POA: Diagnosis not present

## 2013-11-02 DIAGNOSIS — K639 Disease of intestine, unspecified: Secondary | ICD-10-CM | POA: Diagnosis not present

## 2013-11-02 DIAGNOSIS — G479 Sleep disorder, unspecified: Secondary | ICD-10-CM | POA: Diagnosis not present

## 2013-11-02 DIAGNOSIS — I10 Essential (primary) hypertension: Secondary | ICD-10-CM | POA: Diagnosis not present

## 2013-11-02 DIAGNOSIS — Z806 Family history of leukemia: Secondary | ICD-10-CM | POA: Diagnosis not present

## 2013-11-02 DIAGNOSIS — Z96659 Presence of unspecified artificial knee joint: Secondary | ICD-10-CM | POA: Diagnosis not present

## 2013-11-02 DIAGNOSIS — Z8249 Family history of ischemic heart disease and other diseases of the circulatory system: Secondary | ICD-10-CM | POA: Diagnosis not present

## 2013-11-02 DIAGNOSIS — K219 Gastro-esophageal reflux disease without esophagitis: Secondary | ICD-10-CM | POA: Diagnosis not present

## 2013-11-03 DIAGNOSIS — C8589 Other specified types of non-Hodgkin lymphoma, extranodal and solid organ sites: Secondary | ICD-10-CM | POA: Diagnosis not present

## 2013-11-04 DIAGNOSIS — K219 Gastro-esophageal reflux disease without esophagitis: Secondary | ICD-10-CM | POA: Diagnosis not present

## 2013-11-04 DIAGNOSIS — E7800 Pure hypercholesterolemia, unspecified: Secondary | ICD-10-CM | POA: Diagnosis not present

## 2013-11-04 DIAGNOSIS — K5669 Other intestinal obstruction: Secondary | ICD-10-CM | POA: Diagnosis not present

## 2013-11-04 DIAGNOSIS — E119 Type 2 diabetes mellitus without complications: Secondary | ICD-10-CM | POA: Diagnosis not present

## 2013-11-04 DIAGNOSIS — R141 Gas pain: Secondary | ICD-10-CM | POA: Diagnosis not present

## 2013-11-04 DIAGNOSIS — R1900 Intra-abdominal and pelvic swelling, mass and lump, unspecified site: Secondary | ICD-10-CM | POA: Diagnosis not present

## 2013-11-04 DIAGNOSIS — C8589 Other specified types of non-Hodgkin lymphoma, extranodal and solid organ sites: Secondary | ICD-10-CM | POA: Diagnosis not present

## 2013-11-04 DIAGNOSIS — K56609 Unspecified intestinal obstruction, unspecified as to partial versus complete obstruction: Secondary | ICD-10-CM | POA: Diagnosis not present

## 2013-11-04 DIAGNOSIS — F411 Generalized anxiety disorder: Secondary | ICD-10-CM | POA: Diagnosis present

## 2013-11-04 DIAGNOSIS — G4733 Obstructive sleep apnea (adult) (pediatric): Secondary | ICD-10-CM | POA: Diagnosis not present

## 2013-11-04 DIAGNOSIS — I10 Essential (primary) hypertension: Secondary | ICD-10-CM | POA: Diagnosis not present

## 2013-11-04 DIAGNOSIS — R112 Nausea with vomiting, unspecified: Secondary | ICD-10-CM | POA: Diagnosis not present

## 2013-11-04 DIAGNOSIS — C8583 Other specified types of non-Hodgkin lymphoma, intra-abdominal lymph nodes: Secondary | ICD-10-CM | POA: Diagnosis not present

## 2013-11-04 DIAGNOSIS — R109 Unspecified abdominal pain: Secondary | ICD-10-CM | POA: Diagnosis not present

## 2013-11-04 DIAGNOSIS — G4730 Sleep apnea, unspecified: Secondary | ICD-10-CM | POA: Diagnosis not present

## 2013-11-04 DIAGNOSIS — Z79899 Other long term (current) drug therapy: Secondary | ICD-10-CM | POA: Diagnosis not present

## 2013-11-04 DIAGNOSIS — R1084 Generalized abdominal pain: Secondary | ICD-10-CM | POA: Diagnosis not present

## 2013-11-04 DIAGNOSIS — E860 Dehydration: Secondary | ICD-10-CM | POA: Diagnosis present

## 2013-11-04 DIAGNOSIS — K639 Disease of intestine, unspecified: Secondary | ICD-10-CM | POA: Diagnosis not present

## 2013-11-04 DIAGNOSIS — D47Z9 Other specified neoplasms of uncertain behavior of lymphoid, hematopoietic and related tissue: Secondary | ICD-10-CM | POA: Diagnosis not present

## 2013-11-04 DIAGNOSIS — Z96659 Presence of unspecified artificial knee joint: Secondary | ICD-10-CM | POA: Diagnosis not present

## 2013-11-06 DIAGNOSIS — R109 Unspecified abdominal pain: Secondary | ICD-10-CM | POA: Diagnosis not present

## 2013-11-06 DIAGNOSIS — K56609 Unspecified intestinal obstruction, unspecified as to partial versus complete obstruction: Secondary | ICD-10-CM | POA: Diagnosis not present

## 2013-11-08 DIAGNOSIS — Z452 Encounter for adjustment and management of vascular access device: Secondary | ICD-10-CM | POA: Diagnosis not present

## 2013-11-08 DIAGNOSIS — R110 Nausea: Secondary | ICD-10-CM | POA: Diagnosis not present

## 2013-11-08 DIAGNOSIS — R7309 Other abnormal glucose: Secondary | ICD-10-CM | POA: Diagnosis present

## 2013-11-08 DIAGNOSIS — I10 Essential (primary) hypertension: Secondary | ICD-10-CM | POA: Diagnosis present

## 2013-11-08 DIAGNOSIS — M129 Arthropathy, unspecified: Secondary | ICD-10-CM | POA: Diagnosis present

## 2013-11-08 DIAGNOSIS — Z01810 Encounter for preprocedural cardiovascular examination: Secondary | ICD-10-CM | POA: Diagnosis not present

## 2013-11-08 DIAGNOSIS — Z96659 Presence of unspecified artificial knee joint: Secondary | ICD-10-CM | POA: Diagnosis not present

## 2013-11-08 DIAGNOSIS — K56609 Unspecified intestinal obstruction, unspecified as to partial versus complete obstruction: Secondary | ICD-10-CM | POA: Diagnosis not present

## 2013-11-08 DIAGNOSIS — B370 Candidal stomatitis: Secondary | ICD-10-CM | POA: Diagnosis present

## 2013-11-08 DIAGNOSIS — D709 Neutropenia, unspecified: Secondary | ICD-10-CM | POA: Diagnosis present

## 2013-11-08 DIAGNOSIS — K37 Unspecified appendicitis: Secondary | ICD-10-CM | POA: Diagnosis present

## 2013-11-08 DIAGNOSIS — R1084 Generalized abdominal pain: Secondary | ICD-10-CM | POA: Diagnosis not present

## 2013-11-08 DIAGNOSIS — F329 Major depressive disorder, single episode, unspecified: Secondary | ICD-10-CM | POA: Diagnosis present

## 2013-11-08 DIAGNOSIS — D61811 Other drug-induced pancytopenia: Secondary | ICD-10-CM | POA: Diagnosis present

## 2013-11-08 DIAGNOSIS — F3289 Other specified depressive episodes: Secondary | ICD-10-CM | POA: Diagnosis present

## 2013-11-08 DIAGNOSIS — K219 Gastro-esophageal reflux disease without esophagitis: Secondary | ICD-10-CM | POA: Diagnosis present

## 2013-11-08 DIAGNOSIS — I519 Heart disease, unspecified: Secondary | ICD-10-CM | POA: Diagnosis not present

## 2013-11-08 DIAGNOSIS — R112 Nausea with vomiting, unspecified: Secondary | ICD-10-CM | POA: Diagnosis not present

## 2013-11-08 DIAGNOSIS — Z7401 Bed confinement status: Secondary | ICD-10-CM | POA: Diagnosis not present

## 2013-11-08 DIAGNOSIS — Z9089 Acquired absence of other organs: Secondary | ICD-10-CM | POA: Diagnosis not present

## 2013-11-08 DIAGNOSIS — Z8049 Family history of malignant neoplasm of other genital organs: Secondary | ICD-10-CM | POA: Diagnosis not present

## 2013-11-08 DIAGNOSIS — F411 Generalized anxiety disorder: Secondary | ICD-10-CM | POA: Diagnosis present

## 2013-11-08 DIAGNOSIS — C8589 Other specified types of non-Hodgkin lymphoma, extranodal and solid organ sites: Secondary | ICD-10-CM | POA: Diagnosis not present

## 2013-11-08 DIAGNOSIS — Z833 Family history of diabetes mellitus: Secondary | ICD-10-CM | POA: Diagnosis not present

## 2013-11-08 DIAGNOSIS — M2550 Pain in unspecified joint: Secondary | ICD-10-CM | POA: Diagnosis not present

## 2013-11-08 DIAGNOSIS — K5669 Other intestinal obstruction: Secondary | ICD-10-CM | POA: Diagnosis present

## 2013-11-08 DIAGNOSIS — E876 Hypokalemia: Secondary | ICD-10-CM | POA: Diagnosis present

## 2013-11-08 DIAGNOSIS — I252 Old myocardial infarction: Secondary | ICD-10-CM | POA: Diagnosis not present

## 2013-11-28 DIAGNOSIS — C8589 Other specified types of non-Hodgkin lymphoma, extranodal and solid organ sites: Secondary | ICD-10-CM | POA: Diagnosis not present

## 2013-11-30 DIAGNOSIS — Z9889 Other specified postprocedural states: Secondary | ICD-10-CM | POA: Diagnosis not present

## 2013-11-30 DIAGNOSIS — C8589 Other specified types of non-Hodgkin lymphoma, extranodal and solid organ sites: Secondary | ICD-10-CM | POA: Diagnosis not present

## 2013-12-01 DIAGNOSIS — R112 Nausea with vomiting, unspecified: Secondary | ICD-10-CM | POA: Diagnosis not present

## 2013-12-01 DIAGNOSIS — Z9889 Other specified postprocedural states: Secondary | ICD-10-CM | POA: Diagnosis not present

## 2013-12-01 DIAGNOSIS — C8589 Other specified types of non-Hodgkin lymphoma, extranodal and solid organ sites: Secondary | ICD-10-CM | POA: Diagnosis not present

## 2013-12-02 DIAGNOSIS — M159 Polyosteoarthritis, unspecified: Secondary | ICD-10-CM | POA: Diagnosis not present

## 2013-12-02 DIAGNOSIS — F3289 Other specified depressive episodes: Secondary | ICD-10-CM | POA: Diagnosis not present

## 2013-12-02 DIAGNOSIS — I10 Essential (primary) hypertension: Secondary | ICD-10-CM | POA: Diagnosis not present

## 2013-12-02 DIAGNOSIS — Z9221 Personal history of antineoplastic chemotherapy: Secondary | ICD-10-CM | POA: Diagnosis not present

## 2013-12-02 DIAGNOSIS — C8589 Other specified types of non-Hodgkin lymphoma, extranodal and solid organ sites: Secondary | ICD-10-CM | POA: Diagnosis not present

## 2013-12-02 DIAGNOSIS — K219 Gastro-esophageal reflux disease without esophagitis: Secondary | ICD-10-CM | POA: Diagnosis not present

## 2013-12-02 DIAGNOSIS — D702 Other drug-induced agranulocytosis: Secondary | ICD-10-CM | POA: Diagnosis not present

## 2013-12-02 DIAGNOSIS — R1900 Intra-abdominal and pelvic swelling, mass and lump, unspecified site: Secondary | ICD-10-CM | POA: Diagnosis not present

## 2013-12-02 DIAGNOSIS — K5732 Diverticulitis of large intestine without perforation or abscess without bleeding: Secondary | ICD-10-CM | POA: Diagnosis not present

## 2013-12-02 DIAGNOSIS — E119 Type 2 diabetes mellitus without complications: Secondary | ICD-10-CM | POA: Diagnosis not present

## 2013-12-02 DIAGNOSIS — F329 Major depressive disorder, single episode, unspecified: Secondary | ICD-10-CM | POA: Diagnosis not present

## 2013-12-05 DIAGNOSIS — M159 Polyosteoarthritis, unspecified: Secondary | ICD-10-CM | POA: Diagnosis not present

## 2013-12-05 DIAGNOSIS — D61810 Antineoplastic chemotherapy induced pancytopenia: Secondary | ICD-10-CM | POA: Diagnosis present

## 2013-12-05 DIAGNOSIS — E119 Type 2 diabetes mellitus without complications: Secondary | ICD-10-CM | POA: Diagnosis present

## 2013-12-05 DIAGNOSIS — R1900 Intra-abdominal and pelvic swelling, mass and lump, unspecified site: Secondary | ICD-10-CM | POA: Diagnosis not present

## 2013-12-05 DIAGNOSIS — K5732 Diverticulitis of large intestine without perforation or abscess without bleeding: Secondary | ICD-10-CM | POA: Diagnosis not present

## 2013-12-05 DIAGNOSIS — Z79899 Other long term (current) drug therapy: Secondary | ICD-10-CM | POA: Diagnosis not present

## 2013-12-05 DIAGNOSIS — R5081 Fever presenting with conditions classified elsewhere: Secondary | ICD-10-CM | POA: Diagnosis not present

## 2013-12-05 DIAGNOSIS — R079 Chest pain, unspecified: Secondary | ICD-10-CM | POA: Diagnosis not present

## 2013-12-05 DIAGNOSIS — I4891 Unspecified atrial fibrillation: Secondary | ICD-10-CM | POA: Diagnosis present

## 2013-12-05 DIAGNOSIS — E7800 Pure hypercholesterolemia, unspecified: Secondary | ICD-10-CM | POA: Diagnosis present

## 2013-12-05 DIAGNOSIS — R509 Fever, unspecified: Secondary | ICD-10-CM | POA: Diagnosis not present

## 2013-12-05 DIAGNOSIS — N390 Urinary tract infection, site not specified: Secondary | ICD-10-CM | POA: Diagnosis not present

## 2013-12-05 DIAGNOSIS — C8589 Other specified types of non-Hodgkin lymphoma, extranodal and solid organ sites: Secondary | ICD-10-CM | POA: Diagnosis not present

## 2013-12-05 DIAGNOSIS — G4730 Sleep apnea, unspecified: Secondary | ICD-10-CM | POA: Diagnosis present

## 2013-12-05 DIAGNOSIS — C8583 Other specified types of non-Hodgkin lymphoma, intra-abdominal lymph nodes: Secondary | ICD-10-CM | POA: Diagnosis not present

## 2013-12-05 DIAGNOSIS — A498 Other bacterial infections of unspecified site: Secondary | ICD-10-CM | POA: Diagnosis present

## 2013-12-05 DIAGNOSIS — K219 Gastro-esophageal reflux disease without esophagitis: Secondary | ICD-10-CM | POA: Diagnosis present

## 2013-12-05 DIAGNOSIS — M129 Arthropathy, unspecified: Secondary | ICD-10-CM | POA: Diagnosis present

## 2013-12-05 DIAGNOSIS — A419 Sepsis, unspecified organism: Secondary | ICD-10-CM | POA: Diagnosis not present

## 2013-12-05 DIAGNOSIS — F411 Generalized anxiety disorder: Secondary | ICD-10-CM | POA: Diagnosis present

## 2013-12-05 DIAGNOSIS — I10 Essential (primary) hypertension: Secondary | ICD-10-CM | POA: Diagnosis present

## 2013-12-05 DIAGNOSIS — D709 Neutropenia, unspecified: Secondary | ICD-10-CM | POA: Diagnosis not present

## 2013-12-05 DIAGNOSIS — T451X5A Adverse effect of antineoplastic and immunosuppressive drugs, initial encounter: Secondary | ICD-10-CM | POA: Diagnosis present

## 2013-12-06 DIAGNOSIS — M159 Polyosteoarthritis, unspecified: Secondary | ICD-10-CM | POA: Diagnosis not present

## 2013-12-06 DIAGNOSIS — C8589 Other specified types of non-Hodgkin lymphoma, extranodal and solid organ sites: Secondary | ICD-10-CM | POA: Diagnosis not present

## 2013-12-06 DIAGNOSIS — K5732 Diverticulitis of large intestine without perforation or abscess without bleeding: Secondary | ICD-10-CM | POA: Diagnosis not present

## 2013-12-06 DIAGNOSIS — I10 Essential (primary) hypertension: Secondary | ICD-10-CM | POA: Diagnosis not present

## 2013-12-06 DIAGNOSIS — E119 Type 2 diabetes mellitus without complications: Secondary | ICD-10-CM | POA: Diagnosis not present

## 2013-12-06 DIAGNOSIS — R1900 Intra-abdominal and pelvic swelling, mass and lump, unspecified site: Secondary | ICD-10-CM | POA: Diagnosis not present

## 2013-12-07 DIAGNOSIS — M159 Polyosteoarthritis, unspecified: Secondary | ICD-10-CM | POA: Diagnosis not present

## 2013-12-07 DIAGNOSIS — K5732 Diverticulitis of large intestine without perforation or abscess without bleeding: Secondary | ICD-10-CM | POA: Diagnosis not present

## 2013-12-07 DIAGNOSIS — C8589 Other specified types of non-Hodgkin lymphoma, extranodal and solid organ sites: Secondary | ICD-10-CM | POA: Diagnosis not present

## 2013-12-07 DIAGNOSIS — D709 Neutropenia, unspecified: Secondary | ICD-10-CM | POA: Diagnosis not present

## 2013-12-07 DIAGNOSIS — E119 Type 2 diabetes mellitus without complications: Secondary | ICD-10-CM | POA: Diagnosis not present

## 2013-12-07 DIAGNOSIS — R079 Chest pain, unspecified: Secondary | ICD-10-CM | POA: Diagnosis not present

## 2013-12-07 DIAGNOSIS — A419 Sepsis, unspecified organism: Secondary | ICD-10-CM | POA: Diagnosis not present

## 2013-12-07 DIAGNOSIS — R1900 Intra-abdominal and pelvic swelling, mass and lump, unspecified site: Secondary | ICD-10-CM | POA: Diagnosis not present

## 2013-12-07 DIAGNOSIS — I10 Essential (primary) hypertension: Secondary | ICD-10-CM | POA: Diagnosis not present

## 2013-12-13 DIAGNOSIS — E119 Type 2 diabetes mellitus without complications: Secondary | ICD-10-CM | POA: Diagnosis not present

## 2013-12-13 DIAGNOSIS — I10 Essential (primary) hypertension: Secondary | ICD-10-CM | POA: Diagnosis not present

## 2013-12-13 DIAGNOSIS — R1900 Intra-abdominal and pelvic swelling, mass and lump, unspecified site: Secondary | ICD-10-CM | POA: Diagnosis not present

## 2013-12-13 DIAGNOSIS — C8589 Other specified types of non-Hodgkin lymphoma, extranodal and solid organ sites: Secondary | ICD-10-CM | POA: Diagnosis not present

## 2013-12-13 DIAGNOSIS — M159 Polyosteoarthritis, unspecified: Secondary | ICD-10-CM | POA: Diagnosis not present

## 2013-12-13 DIAGNOSIS — K5732 Diverticulitis of large intestine without perforation or abscess without bleeding: Secondary | ICD-10-CM | POA: Diagnosis not present

## 2013-12-14 DIAGNOSIS — I10 Essential (primary) hypertension: Secondary | ICD-10-CM | POA: Diagnosis not present

## 2013-12-14 DIAGNOSIS — M159 Polyosteoarthritis, unspecified: Secondary | ICD-10-CM | POA: Diagnosis not present

## 2013-12-14 DIAGNOSIS — E119 Type 2 diabetes mellitus without complications: Secondary | ICD-10-CM | POA: Diagnosis not present

## 2013-12-14 DIAGNOSIS — R1900 Intra-abdominal and pelvic swelling, mass and lump, unspecified site: Secondary | ICD-10-CM | POA: Diagnosis not present

## 2013-12-14 DIAGNOSIS — C8589 Other specified types of non-Hodgkin lymphoma, extranodal and solid organ sites: Secondary | ICD-10-CM | POA: Diagnosis not present

## 2013-12-14 DIAGNOSIS — K5732 Diverticulitis of large intestine without perforation or abscess without bleeding: Secondary | ICD-10-CM | POA: Diagnosis not present

## 2013-12-15 DIAGNOSIS — E119 Type 2 diabetes mellitus without complications: Secondary | ICD-10-CM | POA: Diagnosis not present

## 2013-12-15 DIAGNOSIS — M159 Polyosteoarthritis, unspecified: Secondary | ICD-10-CM | POA: Diagnosis not present

## 2013-12-15 DIAGNOSIS — R1900 Intra-abdominal and pelvic swelling, mass and lump, unspecified site: Secondary | ICD-10-CM | POA: Diagnosis not present

## 2013-12-15 DIAGNOSIS — K5732 Diverticulitis of large intestine without perforation or abscess without bleeding: Secondary | ICD-10-CM | POA: Diagnosis not present

## 2013-12-15 DIAGNOSIS — C8589 Other specified types of non-Hodgkin lymphoma, extranodal and solid organ sites: Secondary | ICD-10-CM | POA: Diagnosis not present

## 2013-12-15 DIAGNOSIS — I10 Essential (primary) hypertension: Secondary | ICD-10-CM | POA: Diagnosis not present

## 2013-12-19 DIAGNOSIS — K219 Gastro-esophageal reflux disease without esophagitis: Secondary | ICD-10-CM | POA: Diagnosis not present

## 2013-12-19 DIAGNOSIS — K5732 Diverticulitis of large intestine without perforation or abscess without bleeding: Secondary | ICD-10-CM | POA: Diagnosis not present

## 2013-12-19 DIAGNOSIS — Z820 Family history of epilepsy and other diseases of the nervous system: Secondary | ICD-10-CM | POA: Diagnosis not present

## 2013-12-19 DIAGNOSIS — F329 Major depressive disorder, single episode, unspecified: Secondary | ICD-10-CM | POA: Diagnosis not present

## 2013-12-19 DIAGNOSIS — F411 Generalized anxiety disorder: Secondary | ICD-10-CM | POA: Diagnosis not present

## 2013-12-19 DIAGNOSIS — Z8249 Family history of ischemic heart disease and other diseases of the circulatory system: Secondary | ICD-10-CM | POA: Diagnosis not present

## 2013-12-19 DIAGNOSIS — F3289 Other specified depressive episodes: Secondary | ICD-10-CM | POA: Diagnosis not present

## 2013-12-19 DIAGNOSIS — M159 Polyosteoarthritis, unspecified: Secondary | ICD-10-CM | POA: Diagnosis not present

## 2013-12-19 DIAGNOSIS — R112 Nausea with vomiting, unspecified: Secondary | ICD-10-CM | POA: Diagnosis not present

## 2013-12-19 DIAGNOSIS — E119 Type 2 diabetes mellitus without complications: Secondary | ICD-10-CM | POA: Diagnosis not present

## 2013-12-19 DIAGNOSIS — M129 Arthropathy, unspecified: Secondary | ICD-10-CM | POA: Diagnosis not present

## 2013-12-19 DIAGNOSIS — C8589 Other specified types of non-Hodgkin lymphoma, extranodal and solid organ sites: Secondary | ICD-10-CM | POA: Diagnosis not present

## 2013-12-19 DIAGNOSIS — E785 Hyperlipidemia, unspecified: Secondary | ICD-10-CM | POA: Diagnosis not present

## 2013-12-19 DIAGNOSIS — Z9221 Personal history of antineoplastic chemotherapy: Secondary | ICD-10-CM | POA: Diagnosis not present

## 2013-12-19 DIAGNOSIS — I10 Essential (primary) hypertension: Secondary | ICD-10-CM | POA: Diagnosis not present

## 2013-12-19 DIAGNOSIS — K37 Unspecified appendicitis: Secondary | ICD-10-CM | POA: Diagnosis not present

## 2013-12-19 DIAGNOSIS — Z8049 Family history of malignant neoplasm of other genital organs: Secondary | ICD-10-CM | POA: Diagnosis not present

## 2013-12-19 DIAGNOSIS — R1900 Intra-abdominal and pelvic swelling, mass and lump, unspecified site: Secondary | ICD-10-CM | POA: Diagnosis not present

## 2013-12-19 DIAGNOSIS — Z806 Family history of leukemia: Secondary | ICD-10-CM | POA: Diagnosis not present

## 2013-12-19 DIAGNOSIS — R1031 Right lower quadrant pain: Secondary | ICD-10-CM | POA: Diagnosis not present

## 2013-12-19 DIAGNOSIS — Z833 Family history of diabetes mellitus: Secondary | ICD-10-CM | POA: Diagnosis not present

## 2013-12-19 DIAGNOSIS — K3580 Unspecified acute appendicitis: Secondary | ICD-10-CM | POA: Diagnosis not present

## 2013-12-20 DIAGNOSIS — K3580 Unspecified acute appendicitis: Secondary | ICD-10-CM | POA: Diagnosis not present

## 2013-12-20 DIAGNOSIS — K37 Unspecified appendicitis: Secondary | ICD-10-CM | POA: Diagnosis not present

## 2013-12-22 DIAGNOSIS — I10 Essential (primary) hypertension: Secondary | ICD-10-CM | POA: Diagnosis not present

## 2013-12-22 DIAGNOSIS — K5732 Diverticulitis of large intestine without perforation or abscess without bleeding: Secondary | ICD-10-CM | POA: Diagnosis not present

## 2013-12-22 DIAGNOSIS — M159 Polyosteoarthritis, unspecified: Secondary | ICD-10-CM | POA: Diagnosis not present

## 2013-12-22 DIAGNOSIS — C8589 Other specified types of non-Hodgkin lymphoma, extranodal and solid organ sites: Secondary | ICD-10-CM | POA: Diagnosis not present

## 2013-12-22 DIAGNOSIS — R1900 Intra-abdominal and pelvic swelling, mass and lump, unspecified site: Secondary | ICD-10-CM | POA: Diagnosis not present

## 2013-12-22 DIAGNOSIS — E119 Type 2 diabetes mellitus without complications: Secondary | ICD-10-CM | POA: Diagnosis not present

## 2013-12-26 DIAGNOSIS — M159 Polyosteoarthritis, unspecified: Secondary | ICD-10-CM | POA: Diagnosis not present

## 2013-12-26 DIAGNOSIS — R1900 Intra-abdominal and pelvic swelling, mass and lump, unspecified site: Secondary | ICD-10-CM | POA: Diagnosis not present

## 2013-12-26 DIAGNOSIS — K5732 Diverticulitis of large intestine without perforation or abscess without bleeding: Secondary | ICD-10-CM | POA: Diagnosis not present

## 2013-12-26 DIAGNOSIS — I10 Essential (primary) hypertension: Secondary | ICD-10-CM | POA: Diagnosis not present

## 2013-12-26 DIAGNOSIS — E119 Type 2 diabetes mellitus without complications: Secondary | ICD-10-CM | POA: Diagnosis not present

## 2013-12-26 DIAGNOSIS — C8589 Other specified types of non-Hodgkin lymphoma, extranodal and solid organ sites: Secondary | ICD-10-CM | POA: Diagnosis not present

## 2013-12-29 DIAGNOSIS — M159 Polyosteoarthritis, unspecified: Secondary | ICD-10-CM | POA: Diagnosis not present

## 2013-12-29 DIAGNOSIS — H3581 Retinal edema: Secondary | ICD-10-CM | POA: Diagnosis not present

## 2013-12-29 DIAGNOSIS — R1900 Intra-abdominal and pelvic swelling, mass and lump, unspecified site: Secondary | ICD-10-CM | POA: Diagnosis not present

## 2013-12-29 DIAGNOSIS — E119 Type 2 diabetes mellitus without complications: Secondary | ICD-10-CM | POA: Diagnosis not present

## 2013-12-29 DIAGNOSIS — I10 Essential (primary) hypertension: Secondary | ICD-10-CM | POA: Diagnosis not present

## 2013-12-29 DIAGNOSIS — K5732 Diverticulitis of large intestine without perforation or abscess without bleeding: Secondary | ICD-10-CM | POA: Diagnosis not present

## 2013-12-29 DIAGNOSIS — H348392 Tributary (branch) retinal vein occlusion, unspecified eye, stable: Secondary | ICD-10-CM | POA: Diagnosis not present

## 2013-12-29 DIAGNOSIS — C8589 Other specified types of non-Hodgkin lymphoma, extranodal and solid organ sites: Secondary | ICD-10-CM | POA: Diagnosis not present

## 2014-01-02 DIAGNOSIS — C8589 Other specified types of non-Hodgkin lymphoma, extranodal and solid organ sites: Secondary | ICD-10-CM | POA: Diagnosis not present

## 2014-01-03 DIAGNOSIS — C8589 Other specified types of non-Hodgkin lymphoma, extranodal and solid organ sites: Secondary | ICD-10-CM | POA: Diagnosis not present

## 2014-01-04 DIAGNOSIS — C8589 Other specified types of non-Hodgkin lymphoma, extranodal and solid organ sites: Secondary | ICD-10-CM | POA: Diagnosis not present

## 2014-01-04 DIAGNOSIS — D702 Other drug-induced agranulocytosis: Secondary | ICD-10-CM | POA: Diagnosis not present

## 2014-01-05 DIAGNOSIS — C8589 Other specified types of non-Hodgkin lymphoma, extranodal and solid organ sites: Secondary | ICD-10-CM | POA: Diagnosis not present

## 2014-01-05 DIAGNOSIS — R1900 Intra-abdominal and pelvic swelling, mass and lump, unspecified site: Secondary | ICD-10-CM | POA: Diagnosis not present

## 2014-01-05 DIAGNOSIS — I10 Essential (primary) hypertension: Secondary | ICD-10-CM | POA: Diagnosis not present

## 2014-01-05 DIAGNOSIS — K5732 Diverticulitis of large intestine without perforation or abscess without bleeding: Secondary | ICD-10-CM | POA: Diagnosis not present

## 2014-01-05 DIAGNOSIS — M159 Polyosteoarthritis, unspecified: Secondary | ICD-10-CM | POA: Diagnosis not present

## 2014-01-05 DIAGNOSIS — E119 Type 2 diabetes mellitus without complications: Secondary | ICD-10-CM | POA: Diagnosis not present

## 2014-01-10 DIAGNOSIS — D702 Other drug-induced agranulocytosis: Secondary | ICD-10-CM | POA: Diagnosis not present

## 2014-01-10 DIAGNOSIS — Z5111 Encounter for antineoplastic chemotherapy: Secondary | ICD-10-CM | POA: Diagnosis not present

## 2014-01-10 DIAGNOSIS — C8589 Other specified types of non-Hodgkin lymphoma, extranodal and solid organ sites: Secondary | ICD-10-CM | POA: Diagnosis not present

## 2014-01-10 DIAGNOSIS — E876 Hypokalemia: Secondary | ICD-10-CM | POA: Diagnosis not present

## 2014-01-11 DIAGNOSIS — E860 Dehydration: Secondary | ICD-10-CM | POA: Diagnosis not present

## 2014-01-11 DIAGNOSIS — C8589 Other specified types of non-Hodgkin lymphoma, extranodal and solid organ sites: Secondary | ICD-10-CM | POA: Diagnosis not present

## 2014-01-11 DIAGNOSIS — D702 Other drug-induced agranulocytosis: Secondary | ICD-10-CM | POA: Diagnosis not present

## 2014-01-12 DIAGNOSIS — R1900 Intra-abdominal and pelvic swelling, mass and lump, unspecified site: Secondary | ICD-10-CM | POA: Diagnosis not present

## 2014-01-12 DIAGNOSIS — C8589 Other specified types of non-Hodgkin lymphoma, extranodal and solid organ sites: Secondary | ICD-10-CM | POA: Diagnosis not present

## 2014-01-12 DIAGNOSIS — D702 Other drug-induced agranulocytosis: Secondary | ICD-10-CM | POA: Diagnosis not present

## 2014-01-12 DIAGNOSIS — I10 Essential (primary) hypertension: Secondary | ICD-10-CM | POA: Diagnosis not present

## 2014-01-12 DIAGNOSIS — E119 Type 2 diabetes mellitus without complications: Secondary | ICD-10-CM | POA: Diagnosis not present

## 2014-01-12 DIAGNOSIS — K5732 Diverticulitis of large intestine without perforation or abscess without bleeding: Secondary | ICD-10-CM | POA: Diagnosis not present

## 2014-01-12 DIAGNOSIS — M159 Polyosteoarthritis, unspecified: Secondary | ICD-10-CM | POA: Diagnosis not present

## 2014-01-13 DIAGNOSIS — C8589 Other specified types of non-Hodgkin lymphoma, extranodal and solid organ sites: Secondary | ICD-10-CM | POA: Diagnosis not present

## 2014-01-13 DIAGNOSIS — Z5111 Encounter for antineoplastic chemotherapy: Secondary | ICD-10-CM | POA: Diagnosis not present

## 2014-01-13 DIAGNOSIS — D702 Other drug-induced agranulocytosis: Secondary | ICD-10-CM | POA: Diagnosis not present

## 2014-01-16 DIAGNOSIS — F329 Major depressive disorder, single episode, unspecified: Secondary | ICD-10-CM | POA: Diagnosis not present

## 2014-01-16 DIAGNOSIS — C8589 Other specified types of non-Hodgkin lymphoma, extranodal and solid organ sites: Secondary | ICD-10-CM | POA: Diagnosis not present

## 2014-01-16 DIAGNOSIS — R5383 Other fatigue: Secondary | ICD-10-CM | POA: Diagnosis not present

## 2014-01-16 DIAGNOSIS — F3289 Other specified depressive episodes: Secondary | ICD-10-CM | POA: Diagnosis not present

## 2014-01-16 DIAGNOSIS — F411 Generalized anxiety disorder: Secondary | ICD-10-CM | POA: Diagnosis not present

## 2014-01-16 DIAGNOSIS — R5381 Other malaise: Secondary | ICD-10-CM | POA: Diagnosis not present

## 2014-01-19 DIAGNOSIS — E119 Type 2 diabetes mellitus without complications: Secondary | ICD-10-CM | POA: Diagnosis not present

## 2014-01-19 DIAGNOSIS — M159 Polyosteoarthritis, unspecified: Secondary | ICD-10-CM | POA: Diagnosis not present

## 2014-01-19 DIAGNOSIS — C8589 Other specified types of non-Hodgkin lymphoma, extranodal and solid organ sites: Secondary | ICD-10-CM | POA: Diagnosis not present

## 2014-01-19 DIAGNOSIS — R1900 Intra-abdominal and pelvic swelling, mass and lump, unspecified site: Secondary | ICD-10-CM | POA: Diagnosis not present

## 2014-01-19 DIAGNOSIS — K5732 Diverticulitis of large intestine without perforation or abscess without bleeding: Secondary | ICD-10-CM | POA: Diagnosis not present

## 2014-01-19 DIAGNOSIS — I10 Essential (primary) hypertension: Secondary | ICD-10-CM | POA: Diagnosis not present

## 2014-01-23 DIAGNOSIS — C8589 Other specified types of non-Hodgkin lymphoma, extranodal and solid organ sites: Secondary | ICD-10-CM | POA: Diagnosis not present

## 2014-01-23 DIAGNOSIS — D702 Other drug-induced agranulocytosis: Secondary | ICD-10-CM | POA: Diagnosis not present

## 2014-01-24 DIAGNOSIS — R112 Nausea with vomiting, unspecified: Secondary | ICD-10-CM | POA: Diagnosis not present

## 2014-01-24 DIAGNOSIS — D702 Other drug-induced agranulocytosis: Secondary | ICD-10-CM | POA: Diagnosis not present

## 2014-01-24 DIAGNOSIS — C8589 Other specified types of non-Hodgkin lymphoma, extranodal and solid organ sites: Secondary | ICD-10-CM | POA: Diagnosis not present

## 2014-01-25 DIAGNOSIS — I10 Essential (primary) hypertension: Secondary | ICD-10-CM | POA: Diagnosis not present

## 2014-01-25 DIAGNOSIS — C8589 Other specified types of non-Hodgkin lymphoma, extranodal and solid organ sites: Secondary | ICD-10-CM | POA: Diagnosis not present

## 2014-01-25 DIAGNOSIS — K5732 Diverticulitis of large intestine without perforation or abscess without bleeding: Secondary | ICD-10-CM | POA: Diagnosis not present

## 2014-01-25 DIAGNOSIS — R1900 Intra-abdominal and pelvic swelling, mass and lump, unspecified site: Secondary | ICD-10-CM | POA: Diagnosis not present

## 2014-01-25 DIAGNOSIS — D702 Other drug-induced agranulocytosis: Secondary | ICD-10-CM | POA: Diagnosis not present

## 2014-01-25 DIAGNOSIS — E119 Type 2 diabetes mellitus without complications: Secondary | ICD-10-CM | POA: Diagnosis not present

## 2014-01-25 DIAGNOSIS — M159 Polyosteoarthritis, unspecified: Secondary | ICD-10-CM | POA: Diagnosis not present

## 2014-01-29 DIAGNOSIS — D61818 Other pancytopenia: Secondary | ICD-10-CM | POA: Diagnosis not present

## 2014-01-29 DIAGNOSIS — B961 Klebsiella pneumoniae [K. pneumoniae] as the cause of diseases classified elsewhere: Secondary | ICD-10-CM | POA: Diagnosis not present

## 2014-01-29 DIAGNOSIS — F411 Generalized anxiety disorder: Secondary | ICD-10-CM | POA: Diagnosis present

## 2014-01-29 DIAGNOSIS — R5381 Other malaise: Secondary | ICD-10-CM | POA: Diagnosis not present

## 2014-01-29 DIAGNOSIS — M129 Arthropathy, unspecified: Secondary | ICD-10-CM | POA: Diagnosis present

## 2014-01-29 DIAGNOSIS — N390 Urinary tract infection, site not specified: Secondary | ICD-10-CM | POA: Diagnosis present

## 2014-01-29 DIAGNOSIS — N3090 Cystitis, unspecified without hematuria: Secondary | ICD-10-CM | POA: Diagnosis not present

## 2014-01-29 DIAGNOSIS — D702 Other drug-induced agranulocytosis: Secondary | ICD-10-CM | POA: Diagnosis not present

## 2014-01-29 DIAGNOSIS — I959 Hypotension, unspecified: Secondary | ICD-10-CM | POA: Diagnosis present

## 2014-01-29 DIAGNOSIS — R5383 Other fatigue: Secondary | ICD-10-CM | POA: Diagnosis not present

## 2014-01-29 DIAGNOSIS — E119 Type 2 diabetes mellitus without complications: Secondary | ICD-10-CM | POA: Diagnosis present

## 2014-01-29 DIAGNOSIS — R5081 Fever presenting with conditions classified elsewhere: Secondary | ICD-10-CM | POA: Diagnosis not present

## 2014-01-29 DIAGNOSIS — R509 Fever, unspecified: Secondary | ICD-10-CM | POA: Diagnosis not present

## 2014-01-29 DIAGNOSIS — K219 Gastro-esophageal reflux disease without esophagitis: Secondary | ICD-10-CM | POA: Diagnosis present

## 2014-01-29 DIAGNOSIS — F329 Major depressive disorder, single episode, unspecified: Secondary | ICD-10-CM | POA: Diagnosis present

## 2014-01-29 DIAGNOSIS — R404 Transient alteration of awareness: Secondary | ICD-10-CM | POA: Diagnosis not present

## 2014-01-29 DIAGNOSIS — F3289 Other specified depressive episodes: Secondary | ICD-10-CM | POA: Diagnosis present

## 2014-01-29 DIAGNOSIS — I10 Essential (primary) hypertension: Secondary | ICD-10-CM | POA: Diagnosis present

## 2014-01-29 DIAGNOSIS — C8589 Other specified types of non-Hodgkin lymphoma, extranodal and solid organ sites: Secondary | ICD-10-CM | POA: Diagnosis not present

## 2014-02-06 DIAGNOSIS — N390 Urinary tract infection, site not specified: Secondary | ICD-10-CM | POA: Diagnosis not present

## 2014-02-06 DIAGNOSIS — D702 Other drug-induced agranulocytosis: Secondary | ICD-10-CM | POA: Diagnosis not present

## 2014-02-06 DIAGNOSIS — D61818 Other pancytopenia: Secondary | ICD-10-CM | POA: Diagnosis not present

## 2014-02-06 DIAGNOSIS — C8589 Other specified types of non-Hodgkin lymphoma, extranodal and solid organ sites: Secondary | ICD-10-CM | POA: Diagnosis not present

## 2014-02-08 DIAGNOSIS — H3581 Retinal edema: Secondary | ICD-10-CM | POA: Diagnosis not present

## 2014-02-08 DIAGNOSIS — H348392 Tributary (branch) retinal vein occlusion, unspecified eye, stable: Secondary | ICD-10-CM | POA: Diagnosis not present

## 2014-02-14 DIAGNOSIS — M1710 Unilateral primary osteoarthritis, unspecified knee: Secondary | ICD-10-CM | POA: Diagnosis not present

## 2014-02-14 DIAGNOSIS — IMO0002 Reserved for concepts with insufficient information to code with codable children: Secondary | ICD-10-CM | POA: Diagnosis not present

## 2014-02-14 DIAGNOSIS — M25569 Pain in unspecified knee: Secondary | ICD-10-CM | POA: Diagnosis not present

## 2014-02-20 DIAGNOSIS — D61818 Other pancytopenia: Secondary | ICD-10-CM | POA: Diagnosis not present

## 2014-02-20 DIAGNOSIS — C8589 Other specified types of non-Hodgkin lymphoma, extranodal and solid organ sites: Secondary | ICD-10-CM | POA: Diagnosis not present

## 2014-02-20 DIAGNOSIS — D709 Neutropenia, unspecified: Secondary | ICD-10-CM | POA: Diagnosis not present

## 2014-02-20 DIAGNOSIS — R112 Nausea with vomiting, unspecified: Secondary | ICD-10-CM | POA: Diagnosis not present

## 2014-02-20 DIAGNOSIS — R5081 Fever presenting with conditions classified elsewhere: Secondary | ICD-10-CM | POA: Diagnosis not present

## 2014-02-20 DIAGNOSIS — D702 Other drug-induced agranulocytosis: Secondary | ICD-10-CM | POA: Diagnosis not present

## 2014-02-21 DIAGNOSIS — C8589 Other specified types of non-Hodgkin lymphoma, extranodal and solid organ sites: Secondary | ICD-10-CM | POA: Diagnosis not present

## 2014-02-21 DIAGNOSIS — D702 Other drug-induced agranulocytosis: Secondary | ICD-10-CM | POA: Diagnosis not present

## 2014-02-22 DIAGNOSIS — C8589 Other specified types of non-Hodgkin lymphoma, extranodal and solid organ sites: Secondary | ICD-10-CM | POA: Diagnosis not present

## 2014-02-22 DIAGNOSIS — D61818 Other pancytopenia: Secondary | ICD-10-CM | POA: Diagnosis not present

## 2014-02-22 DIAGNOSIS — R5081 Fever presenting with conditions classified elsewhere: Secondary | ICD-10-CM | POA: Diagnosis not present

## 2014-02-22 DIAGNOSIS — D702 Other drug-induced agranulocytosis: Secondary | ICD-10-CM | POA: Diagnosis not present

## 2014-02-23 DIAGNOSIS — D702 Other drug-induced agranulocytosis: Secondary | ICD-10-CM | POA: Diagnosis not present

## 2014-02-23 DIAGNOSIS — C8589 Other specified types of non-Hodgkin lymphoma, extranodal and solid organ sites: Secondary | ICD-10-CM | POA: Diagnosis not present

## 2014-02-24 DIAGNOSIS — R5081 Fever presenting with conditions classified elsewhere: Secondary | ICD-10-CM | POA: Diagnosis not present

## 2014-02-24 DIAGNOSIS — D61818 Other pancytopenia: Secondary | ICD-10-CM | POA: Diagnosis not present

## 2014-02-24 DIAGNOSIS — D709 Neutropenia, unspecified: Secondary | ICD-10-CM | POA: Diagnosis not present

## 2014-02-24 DIAGNOSIS — D702 Other drug-induced agranulocytosis: Secondary | ICD-10-CM | POA: Diagnosis not present

## 2014-02-24 DIAGNOSIS — C8589 Other specified types of non-Hodgkin lymphoma, extranodal and solid organ sites: Secondary | ICD-10-CM | POA: Diagnosis not present

## 2014-02-25 DIAGNOSIS — C8589 Other specified types of non-Hodgkin lymphoma, extranodal and solid organ sites: Secondary | ICD-10-CM | POA: Diagnosis not present

## 2014-02-25 DIAGNOSIS — D702 Other drug-induced agranulocytosis: Secondary | ICD-10-CM | POA: Diagnosis not present

## 2014-02-26 DIAGNOSIS — C8589 Other specified types of non-Hodgkin lymphoma, extranodal and solid organ sites: Secondary | ICD-10-CM | POA: Diagnosis not present

## 2014-02-26 DIAGNOSIS — D702 Other drug-induced agranulocytosis: Secondary | ICD-10-CM | POA: Diagnosis not present

## 2014-02-27 DIAGNOSIS — D61818 Other pancytopenia: Secondary | ICD-10-CM | POA: Diagnosis not present

## 2014-02-27 DIAGNOSIS — D709 Neutropenia, unspecified: Secondary | ICD-10-CM | POA: Diagnosis not present

## 2014-02-27 DIAGNOSIS — D702 Other drug-induced agranulocytosis: Secondary | ICD-10-CM | POA: Diagnosis not present

## 2014-02-27 DIAGNOSIS — R5081 Fever presenting with conditions classified elsewhere: Secondary | ICD-10-CM | POA: Diagnosis not present

## 2014-02-27 DIAGNOSIS — C8589 Other specified types of non-Hodgkin lymphoma, extranodal and solid organ sites: Secondary | ICD-10-CM | POA: Diagnosis not present

## 2014-02-28 DIAGNOSIS — C8589 Other specified types of non-Hodgkin lymphoma, extranodal and solid organ sites: Secondary | ICD-10-CM | POA: Diagnosis not present

## 2014-02-28 DIAGNOSIS — D702 Other drug-induced agranulocytosis: Secondary | ICD-10-CM | POA: Diagnosis not present

## 2014-03-01 DIAGNOSIS — C8589 Other specified types of non-Hodgkin lymphoma, extranodal and solid organ sites: Secondary | ICD-10-CM | POA: Diagnosis not present

## 2014-03-01 DIAGNOSIS — D702 Other drug-induced agranulocytosis: Secondary | ICD-10-CM | POA: Diagnosis not present

## 2014-03-01 DIAGNOSIS — D61818 Other pancytopenia: Secondary | ICD-10-CM | POA: Diagnosis not present

## 2014-03-01 DIAGNOSIS — R5081 Fever presenting with conditions classified elsewhere: Secondary | ICD-10-CM | POA: Diagnosis not present

## 2014-03-01 DIAGNOSIS — D709 Neutropenia, unspecified: Secondary | ICD-10-CM | POA: Diagnosis not present

## 2014-03-02 DIAGNOSIS — C8589 Other specified types of non-Hodgkin lymphoma, extranodal and solid organ sites: Secondary | ICD-10-CM | POA: Diagnosis not present

## 2014-03-02 DIAGNOSIS — Z79899 Other long term (current) drug therapy: Secondary | ICD-10-CM | POA: Diagnosis not present

## 2014-03-09 DIAGNOSIS — C8590 Non-Hodgkin lymphoma, unspecified, unspecified site: Secondary | ICD-10-CM | POA: Diagnosis not present

## 2014-03-09 DIAGNOSIS — R530 Neoplastic (malignant) related fatigue: Secondary | ICD-10-CM | POA: Diagnosis not present

## 2014-03-09 DIAGNOSIS — K5900 Constipation, unspecified: Secondary | ICD-10-CM | POA: Diagnosis not present

## 2014-03-13 DIAGNOSIS — D701 Agranulocytosis secondary to cancer chemotherapy: Secondary | ICD-10-CM | POA: Diagnosis not present

## 2014-03-13 DIAGNOSIS — R112 Nausea with vomiting, unspecified: Secondary | ICD-10-CM | POA: Diagnosis not present

## 2014-03-13 DIAGNOSIS — C8590 Non-Hodgkin lymphoma, unspecified, unspecified site: Secondary | ICD-10-CM | POA: Diagnosis not present

## 2014-03-14 DIAGNOSIS — D701 Agranulocytosis secondary to cancer chemotherapy: Secondary | ICD-10-CM | POA: Diagnosis not present

## 2014-03-14 DIAGNOSIS — C8590 Non-Hodgkin lymphoma, unspecified, unspecified site: Secondary | ICD-10-CM | POA: Diagnosis not present

## 2014-03-15 DIAGNOSIS — C8590 Non-Hodgkin lymphoma, unspecified, unspecified site: Secondary | ICD-10-CM | POA: Diagnosis not present

## 2014-03-15 DIAGNOSIS — D701 Agranulocytosis secondary to cancer chemotherapy: Secondary | ICD-10-CM | POA: Diagnosis not present

## 2014-03-16 DIAGNOSIS — C8590 Non-Hodgkin lymphoma, unspecified, unspecified site: Secondary | ICD-10-CM | POA: Diagnosis not present

## 2014-03-16 DIAGNOSIS — D701 Agranulocytosis secondary to cancer chemotherapy: Secondary | ICD-10-CM | POA: Diagnosis not present

## 2014-03-17 DIAGNOSIS — D701 Agranulocytosis secondary to cancer chemotherapy: Secondary | ICD-10-CM | POA: Diagnosis not present

## 2014-03-17 DIAGNOSIS — C8590 Non-Hodgkin lymphoma, unspecified, unspecified site: Secondary | ICD-10-CM | POA: Diagnosis not present

## 2014-03-18 DIAGNOSIS — T451X5A Adverse effect of antineoplastic and immunosuppressive drugs, initial encounter: Secondary | ICD-10-CM | POA: Diagnosis not present

## 2014-03-18 DIAGNOSIS — D701 Agranulocytosis secondary to cancer chemotherapy: Secondary | ICD-10-CM | POA: Diagnosis not present

## 2014-03-20 DIAGNOSIS — D701 Agranulocytosis secondary to cancer chemotherapy: Secondary | ICD-10-CM | POA: Diagnosis not present

## 2014-03-20 DIAGNOSIS — C8590 Non-Hodgkin lymphoma, unspecified, unspecified site: Secondary | ICD-10-CM | POA: Diagnosis not present

## 2014-03-21 DIAGNOSIS — D701 Agranulocytosis secondary to cancer chemotherapy: Secondary | ICD-10-CM | POA: Diagnosis not present

## 2014-03-21 DIAGNOSIS — C8590 Non-Hodgkin lymphoma, unspecified, unspecified site: Secondary | ICD-10-CM | POA: Diagnosis not present

## 2014-03-21 DIAGNOSIS — E876 Hypokalemia: Secondary | ICD-10-CM | POA: Diagnosis not present

## 2014-03-22 DIAGNOSIS — T451X5A Adverse effect of antineoplastic and immunosuppressive drugs, initial encounter: Secondary | ICD-10-CM | POA: Diagnosis not present

## 2014-03-22 DIAGNOSIS — D701 Agranulocytosis secondary to cancer chemotherapy: Secondary | ICD-10-CM | POA: Diagnosis not present

## 2014-03-22 DIAGNOSIS — C8590 Non-Hodgkin lymphoma, unspecified, unspecified site: Secondary | ICD-10-CM | POA: Diagnosis not present

## 2014-03-22 DIAGNOSIS — E876 Hypokalemia: Secondary | ICD-10-CM | POA: Diagnosis not present

## 2014-03-27 DIAGNOSIS — C8590 Non-Hodgkin lymphoma, unspecified, unspecified site: Secondary | ICD-10-CM | POA: Diagnosis not present

## 2014-04-03 DIAGNOSIS — D701 Agranulocytosis secondary to cancer chemotherapy: Secondary | ICD-10-CM | POA: Diagnosis not present

## 2014-04-03 DIAGNOSIS — C8590 Non-Hodgkin lymphoma, unspecified, unspecified site: Secondary | ICD-10-CM | POA: Diagnosis not present

## 2014-04-06 DIAGNOSIS — H3581 Retinal edema: Secondary | ICD-10-CM | POA: Diagnosis not present

## 2014-04-06 DIAGNOSIS — H34832 Tributary (branch) retinal vein occlusion, left eye: Secondary | ICD-10-CM | POA: Diagnosis not present

## 2014-04-06 DIAGNOSIS — E11329 Type 2 diabetes mellitus with mild nonproliferative diabetic retinopathy without macular edema: Secondary | ICD-10-CM | POA: Diagnosis not present

## 2014-04-10 DIAGNOSIS — N390 Urinary tract infection, site not specified: Secondary | ICD-10-CM | POA: Diagnosis not present

## 2014-04-10 DIAGNOSIS — R351 Nocturia: Secondary | ICD-10-CM | POA: Diagnosis not present

## 2014-04-10 DIAGNOSIS — N3946 Mixed incontinence: Secondary | ICD-10-CM | POA: Diagnosis not present

## 2014-04-19 DIAGNOSIS — N3941 Urge incontinence: Secondary | ICD-10-CM | POA: Diagnosis not present

## 2014-04-28 DIAGNOSIS — F329 Major depressive disorder, single episode, unspecified: Secondary | ICD-10-CM | POA: Diagnosis not present

## 2014-04-28 DIAGNOSIS — F411 Generalized anxiety disorder: Secondary | ICD-10-CM | POA: Diagnosis not present

## 2014-04-28 DIAGNOSIS — K219 Gastro-esophageal reflux disease without esophagitis: Secondary | ICD-10-CM | POA: Diagnosis not present

## 2014-04-28 DIAGNOSIS — T451X5A Adverse effect of antineoplastic and immunosuppressive drugs, initial encounter: Secondary | ICD-10-CM | POA: Diagnosis not present

## 2014-04-28 DIAGNOSIS — G4730 Sleep apnea, unspecified: Secondary | ICD-10-CM | POA: Diagnosis not present

## 2014-04-28 DIAGNOSIS — E780 Pure hypercholesterolemia: Secondary | ICD-10-CM | POA: Diagnosis not present

## 2014-04-28 DIAGNOSIS — D702 Other drug-induced agranulocytosis: Secondary | ICD-10-CM | POA: Diagnosis not present

## 2014-04-28 DIAGNOSIS — I10 Essential (primary) hypertension: Secondary | ICD-10-CM | POA: Diagnosis not present

## 2014-04-28 DIAGNOSIS — E119 Type 2 diabetes mellitus without complications: Secondary | ICD-10-CM | POA: Diagnosis not present

## 2014-04-28 DIAGNOSIS — C8590 Non-Hodgkin lymphoma, unspecified, unspecified site: Secondary | ICD-10-CM | POA: Diagnosis not present

## 2014-04-28 DIAGNOSIS — E876 Hypokalemia: Secondary | ICD-10-CM | POA: Diagnosis not present

## 2014-05-01 DIAGNOSIS — C8590 Non-Hodgkin lymphoma, unspecified, unspecified site: Secondary | ICD-10-CM | POA: Diagnosis not present

## 2014-05-01 DIAGNOSIS — E876 Hypokalemia: Secondary | ICD-10-CM | POA: Diagnosis not present

## 2014-05-01 DIAGNOSIS — F5102 Adjustment insomnia: Secondary | ICD-10-CM | POA: Diagnosis not present

## 2014-05-01 DIAGNOSIS — E041 Nontoxic single thyroid nodule: Secondary | ICD-10-CM | POA: Diagnosis not present

## 2014-05-10 DIAGNOSIS — N3941 Urge incontinence: Secondary | ICD-10-CM | POA: Diagnosis not present

## 2014-05-10 DIAGNOSIS — T148 Other injury of unspecified body region: Secondary | ICD-10-CM | POA: Diagnosis not present

## 2014-05-11 DIAGNOSIS — E041 Nontoxic single thyroid nodule: Secondary | ICD-10-CM | POA: Diagnosis not present

## 2014-05-15 DIAGNOSIS — Z95828 Presence of other vascular implants and grafts: Secondary | ICD-10-CM | POA: Diagnosis not present

## 2014-05-17 DIAGNOSIS — H3581 Retinal edema: Secondary | ICD-10-CM | POA: Diagnosis not present

## 2014-05-17 DIAGNOSIS — H34832 Tributary (branch) retinal vein occlusion, left eye: Secondary | ICD-10-CM | POA: Diagnosis not present

## 2014-05-19 DIAGNOSIS — E041 Nontoxic single thyroid nodule: Secondary | ICD-10-CM | POA: Diagnosis not present

## 2014-05-19 DIAGNOSIS — E042 Nontoxic multinodular goiter: Secondary | ICD-10-CM | POA: Diagnosis not present

## 2014-05-31 DIAGNOSIS — N3941 Urge incontinence: Secondary | ICD-10-CM | POA: Diagnosis not present

## 2014-06-14 DIAGNOSIS — E440 Moderate protein-calorie malnutrition: Secondary | ICD-10-CM | POA: Diagnosis not present

## 2014-06-20 DIAGNOSIS — N3941 Urge incontinence: Secondary | ICD-10-CM | POA: Diagnosis not present

## 2014-06-21 DIAGNOSIS — H34832 Tributary (branch) retinal vein occlusion, left eye: Secondary | ICD-10-CM | POA: Diagnosis not present

## 2014-06-21 DIAGNOSIS — H3581 Retinal edema: Secondary | ICD-10-CM | POA: Diagnosis not present

## 2014-06-21 DIAGNOSIS — E11329 Type 2 diabetes mellitus with mild nonproliferative diabetic retinopathy without macular edema: Secondary | ICD-10-CM | POA: Diagnosis not present

## 2014-06-26 DIAGNOSIS — Z95828 Presence of other vascular implants and grafts: Secondary | ICD-10-CM | POA: Diagnosis not present

## 2014-07-11 DIAGNOSIS — N3941 Urge incontinence: Secondary | ICD-10-CM | POA: Diagnosis not present

## 2014-07-25 DIAGNOSIS — C8590 Non-Hodgkin lymphoma, unspecified, unspecified site: Secondary | ICD-10-CM | POA: Diagnosis not present

## 2014-07-25 DIAGNOSIS — E876 Hypokalemia: Secondary | ICD-10-CM | POA: Diagnosis not present

## 2014-08-01 DIAGNOSIS — N3941 Urge incontinence: Secondary | ICD-10-CM | POA: Diagnosis not present

## 2014-08-07 DIAGNOSIS — Z95828 Presence of other vascular implants and grafts: Secondary | ICD-10-CM | POA: Diagnosis not present

## 2014-08-11 DIAGNOSIS — H34832 Tributary (branch) retinal vein occlusion, left eye: Secondary | ICD-10-CM | POA: Diagnosis not present

## 2014-08-11 DIAGNOSIS — H3581 Retinal edema: Secondary | ICD-10-CM | POA: Diagnosis not present

## 2014-08-11 DIAGNOSIS — E11329 Type 2 diabetes mellitus with mild nonproliferative diabetic retinopathy without macular edema: Secondary | ICD-10-CM | POA: Diagnosis not present

## 2014-08-22 DIAGNOSIS — N3941 Urge incontinence: Secondary | ICD-10-CM | POA: Diagnosis not present

## 2014-09-18 DIAGNOSIS — Z95828 Presence of other vascular implants and grafts: Secondary | ICD-10-CM | POA: Diagnosis not present

## 2014-09-29 ENCOUNTER — Ambulatory Visit: Payer: Medicare Other | Admitting: Pulmonary Disease

## 2014-10-13 DIAGNOSIS — H43813 Vitreous degeneration, bilateral: Secondary | ICD-10-CM | POA: Diagnosis not present

## 2014-10-13 DIAGNOSIS — H34832 Tributary (branch) retinal vein occlusion, left eye: Secondary | ICD-10-CM | POA: Diagnosis not present

## 2014-10-13 DIAGNOSIS — E11329 Type 2 diabetes mellitus with mild nonproliferative diabetic retinopathy without macular edema: Secondary | ICD-10-CM | POA: Diagnosis not present

## 2014-10-13 DIAGNOSIS — H3581 Retinal edema: Secondary | ICD-10-CM | POA: Diagnosis not present

## 2014-10-16 ENCOUNTER — Ambulatory Visit (INDEPENDENT_AMBULATORY_CARE_PROVIDER_SITE_OTHER): Payer: Medicare Other | Admitting: Pulmonary Disease

## 2014-10-16 ENCOUNTER — Encounter: Payer: Self-pay | Admitting: Pulmonary Disease

## 2014-10-16 VITALS — BP 122/74 | HR 72 | Temp 97.8°F | Ht 65.0 in | Wt 184.2 lb

## 2014-10-16 DIAGNOSIS — D440 Neoplasm of uncertain behavior of thyroid gland: Secondary | ICD-10-CM | POA: Diagnosis not present

## 2014-10-16 DIAGNOSIS — G4733 Obstructive sleep apnea (adult) (pediatric): Secondary | ICD-10-CM

## 2014-10-16 NOTE — Patient Instructions (Signed)
Hold on cpap for now, and work on weight loss over the next 4mos. Keep in mind the alternative of a dental appliance if you are unable to lose weight.  followup in 63mos with Dr. Halford Chessman, but can cancel if you are doing well with the weight loss

## 2014-10-16 NOTE — Assessment & Plan Note (Signed)
The pt is having issues with cpap tolerance, and it is primarily related to inconvenience.  She has lost 14 pounds since her last visit, and I have discussed options of either working on weight loss alone for the next 6 months versus considering a dental appliance. She would like to work on the former, and we will schedule a follow-up appointment in 6 months to re-group.

## 2014-10-16 NOTE — Progress Notes (Signed)
   Subjective:    Patient ID: Hailey [name], female    DOB: Dec 26, 1945, 69 y.o.   MRN: [id number]  HPI The patient comes in today for follow-up of her moderate obstructive sleep apnea. She has not been wearing C Pap compliantly because of inconvenience. She also has issues with mask fitting. She has lost 14 pounds since last visit, and feels that her sleep has been adequate despite not wearing her device. She would rather not get back on C Pap if possible.   Review of Systems  Constitutional: Negative for fever and unexpected weight change.  HENT: Negative for congestion, dental problem, ear pain, nosebleeds, postnasal drip, rhinorrhea, sinus pressure, sneezing, sore throat and trouble swallowing.   Eyes: Negative for redness and itching.  Respiratory: Negative for cough, chest tightness, shortness of breath and wheezing.   Cardiovascular: Negative for palpitations and leg swelling.  Gastrointestinal: Negative for nausea and vomiting.  Genitourinary: Negative for dysuria.  Musculoskeletal: Negative for joint swelling.  Skin: Negative for rash.  Neurological: Negative for headaches.  Hematological: Does not bruise/bleed easily.  Psychiatric/Behavioral: Negative for dysphoric mood. The patient is not nervous/anxious.        Objective:   Physical Exam Overweight female in no acute distress Nose without purulence or discharge noted Neck without lymphadenopathy or thyromegaly No skin breakdown or pressure necrosis from the C Pap mask Lower extremities with mild edema, no cyanosis Alert and oriented, moves all 4 extremities.       Assessment & Plan:

## 2014-10-18 DIAGNOSIS — N3946 Mixed incontinence: Secondary | ICD-10-CM | POA: Diagnosis not present

## 2014-10-18 DIAGNOSIS — R351 Nocturia: Secondary | ICD-10-CM | POA: Diagnosis not present

## 2014-10-18 DIAGNOSIS — N390 Urinary tract infection, site not specified: Secondary | ICD-10-CM | POA: Diagnosis not present

## 2014-10-23 DIAGNOSIS — C8590 Non-Hodgkin lymphoma, unspecified, unspecified site: Secondary | ICD-10-CM | POA: Diagnosis not present

## 2014-10-23 DIAGNOSIS — E876 Hypokalemia: Secondary | ICD-10-CM | POA: Diagnosis not present

## 2014-10-23 DIAGNOSIS — K449 Diaphragmatic hernia without obstruction or gangrene: Secondary | ICD-10-CM | POA: Diagnosis not present

## 2014-10-23 DIAGNOSIS — K5730 Diverticulosis of large intestine without perforation or abscess without bleeding: Secondary | ICD-10-CM | POA: Diagnosis not present

## 2014-10-24 DIAGNOSIS — Z95828 Presence of other vascular implants and grafts: Secondary | ICD-10-CM | POA: Diagnosis not present

## 2014-10-24 DIAGNOSIS — C8333 Diffuse large B-cell lymphoma, intra-abdominal lymph nodes: Secondary | ICD-10-CM | POA: Diagnosis not present

## 2014-10-30 DIAGNOSIS — Z01419 Encounter for gynecological examination (general) (routine) without abnormal findings: Secondary | ICD-10-CM | POA: Diagnosis not present

## 2014-10-30 DIAGNOSIS — N952 Postmenopausal atrophic vaginitis: Secondary | ICD-10-CM | POA: Diagnosis not present

## 2014-11-03 DIAGNOSIS — E1165 Type 2 diabetes mellitus with hyperglycemia: Secondary | ICD-10-CM | POA: Diagnosis not present

## 2014-11-03 DIAGNOSIS — E780 Pure hypercholesterolemia: Secondary | ICD-10-CM | POA: Diagnosis not present

## 2014-11-03 DIAGNOSIS — M129 Arthropathy, unspecified: Secondary | ICD-10-CM | POA: Diagnosis not present

## 2014-11-03 DIAGNOSIS — I10 Essential (primary) hypertension: Secondary | ICD-10-CM | POA: Diagnosis not present

## 2014-11-08 ENCOUNTER — Other Ambulatory Visit: Payer: Self-pay

## 2014-11-08 DIAGNOSIS — G4730 Sleep apnea, unspecified: Secondary | ICD-10-CM | POA: Diagnosis not present

## 2014-11-08 DIAGNOSIS — E780 Pure hypercholesterolemia: Secondary | ICD-10-CM | POA: Diagnosis not present

## 2014-11-08 DIAGNOSIS — Z1231 Encounter for screening mammogram for malignant neoplasm of breast: Secondary | ICD-10-CM

## 2014-11-08 DIAGNOSIS — B0089 Other herpesviral infection: Secondary | ICD-10-CM | POA: Diagnosis not present

## 2014-11-08 DIAGNOSIS — E1165 Type 2 diabetes mellitus with hyperglycemia: Secondary | ICD-10-CM | POA: Diagnosis not present

## 2014-11-08 DIAGNOSIS — I10 Essential (primary) hypertension: Secondary | ICD-10-CM | POA: Diagnosis not present

## 2014-11-08 DIAGNOSIS — K219 Gastro-esophageal reflux disease without esophagitis: Secondary | ICD-10-CM | POA: Diagnosis not present

## 2014-11-08 DIAGNOSIS — C8590 Non-Hodgkin lymphoma, unspecified, unspecified site: Secondary | ICD-10-CM | POA: Diagnosis not present

## 2014-11-08 DIAGNOSIS — F328 Other depressive episodes: Secondary | ICD-10-CM | POA: Diagnosis not present

## 2014-11-20 DIAGNOSIS — E042 Nontoxic multinodular goiter: Secondary | ICD-10-CM | POA: Diagnosis not present

## 2014-12-08 ENCOUNTER — Ambulatory Visit
Admission: RE | Admit: 2014-12-08 | Discharge: 2014-12-08 | Disposition: A | Discharge status: Home / Self Care | Payer: Medicare Other | Source: Ambulatory Visit

## 2014-12-08 DIAGNOSIS — Z1231 Encounter for screening mammogram for malignant neoplasm of breast: Secondary | ICD-10-CM

## 2014-12-12 DIAGNOSIS — Z95828 Presence of other vascular implants and grafts: Secondary | ICD-10-CM | POA: Diagnosis not present

## 2014-12-29 DIAGNOSIS — H3581 Retinal edema: Secondary | ICD-10-CM | POA: Diagnosis not present

## 2014-12-29 DIAGNOSIS — H34832 Tributary (branch) retinal vein occlusion, left eye: Secondary | ICD-10-CM | POA: Diagnosis not present

## 2014-12-29 DIAGNOSIS — H43813 Vitreous degeneration, bilateral: Secondary | ICD-10-CM | POA: Diagnosis not present

## 2015-01-24 DIAGNOSIS — C8590 Non-Hodgkin lymphoma, unspecified, unspecified site: Secondary | ICD-10-CM | POA: Diagnosis not present

## 2015-01-24 DIAGNOSIS — Z95828 Presence of other vascular implants and grafts: Secondary | ICD-10-CM | POA: Diagnosis not present

## 2015-01-24 DIAGNOSIS — C8333 Diffuse large B-cell lymphoma, intra-abdominal lymph nodes: Secondary | ICD-10-CM | POA: Diagnosis not present

## 2015-01-24 DIAGNOSIS — E876 Hypokalemia: Secondary | ICD-10-CM | POA: Diagnosis not present

## 2015-01-25 DIAGNOSIS — Z95828 Presence of other vascular implants and grafts: Secondary | ICD-10-CM | POA: Diagnosis not present

## 2015-03-06 DIAGNOSIS — Z95828 Presence of other vascular implants and grafts: Secondary | ICD-10-CM | POA: Diagnosis not present

## 2015-03-06 DIAGNOSIS — D61818 Other pancytopenia: Secondary | ICD-10-CM | POA: Diagnosis not present

## 2015-03-06 DIAGNOSIS — E042 Nontoxic multinodular goiter: Secondary | ICD-10-CM | POA: Diagnosis not present

## 2015-03-22 DIAGNOSIS — E119 Type 2 diabetes mellitus without complications: Secondary | ICD-10-CM | POA: Diagnosis not present

## 2015-03-22 DIAGNOSIS — H35371 Puckering of macula, right eye: Secondary | ICD-10-CM | POA: Diagnosis not present

## 2015-04-16 DIAGNOSIS — D440 Neoplasm of uncertain behavior of thyroid gland: Secondary | ICD-10-CM | POA: Diagnosis not present

## 2015-04-20 DIAGNOSIS — C8330 Diffuse large B-cell lymphoma, unspecified site: Secondary | ICD-10-CM | POA: Diagnosis not present

## 2015-04-20 DIAGNOSIS — J479 Bronchiectasis, uncomplicated: Secondary | ICD-10-CM | POA: Diagnosis not present

## 2015-04-20 DIAGNOSIS — K449 Diaphragmatic hernia without obstruction or gangrene: Secondary | ICD-10-CM | POA: Diagnosis not present

## 2015-04-20 DIAGNOSIS — E041 Nontoxic single thyroid nodule: Secondary | ICD-10-CM | POA: Diagnosis not present

## 2015-04-20 DIAGNOSIS — N281 Cyst of kidney, acquired: Secondary | ICD-10-CM | POA: Diagnosis not present

## 2015-04-20 DIAGNOSIS — C8333 Diffuse large B-cell lymphoma, intra-abdominal lymph nodes: Secondary | ICD-10-CM | POA: Diagnosis not present

## 2015-04-20 DIAGNOSIS — Q278 Other specified congenital malformations of peripheral vascular system: Secondary | ICD-10-CM | POA: Diagnosis not present

## 2015-04-20 DIAGNOSIS — E876 Hypokalemia: Secondary | ICD-10-CM | POA: Diagnosis not present

## 2015-04-20 DIAGNOSIS — M419 Scoliosis, unspecified: Secondary | ICD-10-CM | POA: Diagnosis not present

## 2015-04-20 DIAGNOSIS — I700 Atherosclerosis of aorta: Secondary | ICD-10-CM | POA: Diagnosis not present

## 2015-04-20 DIAGNOSIS — K5730 Diverticulosis of large intestine without perforation or abscess without bleeding: Secondary | ICD-10-CM | POA: Diagnosis not present

## 2015-04-23 DIAGNOSIS — R05 Cough: Secondary | ICD-10-CM | POA: Diagnosis not present

## 2015-04-23 DIAGNOSIS — C8333 Diffuse large B-cell lymphoma, intra-abdominal lymph nodes: Secondary | ICD-10-CM | POA: Diagnosis not present

## 2015-04-23 DIAGNOSIS — E876 Hypokalemia: Secondary | ICD-10-CM | POA: Diagnosis not present

## 2015-04-23 DIAGNOSIS — E041 Nontoxic single thyroid nodule: Secondary | ICD-10-CM | POA: Diagnosis not present

## 2015-04-23 DIAGNOSIS — Z95828 Presence of other vascular implants and grafts: Secondary | ICD-10-CM | POA: Diagnosis not present

## 2015-04-23 DIAGNOSIS — J219 Acute bronchiolitis, unspecified: Secondary | ICD-10-CM | POA: Diagnosis not present

## 2015-04-27 DIAGNOSIS — H3582 Retinal ischemia: Secondary | ICD-10-CM | POA: Diagnosis not present

## 2015-04-27 DIAGNOSIS — H34832 Tributary (branch) retinal vein occlusion, left eye, with macular edema: Secondary | ICD-10-CM | POA: Diagnosis not present

## 2015-04-27 DIAGNOSIS — E113293 Type 2 diabetes mellitus with mild nonproliferative diabetic retinopathy without macular edema, bilateral: Secondary | ICD-10-CM | POA: Diagnosis not present

## 2015-04-27 DIAGNOSIS — H43813 Vitreous degeneration, bilateral: Secondary | ICD-10-CM | POA: Diagnosis not present

## 2015-05-08 DIAGNOSIS — Z95828 Presence of other vascular implants and grafts: Secondary | ICD-10-CM | POA: Diagnosis not present

## 2015-05-08 DIAGNOSIS — R05 Cough: Secondary | ICD-10-CM | POA: Diagnosis not present

## 2015-05-08 DIAGNOSIS — C8333 Diffuse large B-cell lymphoma, intra-abdominal lymph nodes: Secondary | ICD-10-CM | POA: Diagnosis not present

## 2015-05-08 DIAGNOSIS — C8510 Unspecified B-cell lymphoma, unspecified site: Secondary | ICD-10-CM | POA: Diagnosis not present

## 2015-05-10 DIAGNOSIS — R351 Nocturia: Secondary | ICD-10-CM | POA: Diagnosis not present

## 2015-05-10 DIAGNOSIS — N3946 Mixed incontinence: Secondary | ICD-10-CM | POA: Diagnosis not present

## 2015-05-16 DIAGNOSIS — D440 Neoplasm of uncertain behavior of thyroid gland: Secondary | ICD-10-CM | POA: Diagnosis not present

## 2015-05-28 DIAGNOSIS — E119 Type 2 diabetes mellitus without complications: Secondary | ICD-10-CM | POA: Diagnosis not present

## 2015-05-28 DIAGNOSIS — Z79899 Other long term (current) drug therapy: Secondary | ICD-10-CM | POA: Diagnosis not present

## 2015-05-28 DIAGNOSIS — K449 Diaphragmatic hernia without obstruction or gangrene: Secondary | ICD-10-CM | POA: Diagnosis not present

## 2015-05-28 DIAGNOSIS — I10 Essential (primary) hypertension: Secondary | ICD-10-CM | POA: Diagnosis not present

## 2015-05-28 DIAGNOSIS — E041 Nontoxic single thyroid nodule: Secondary | ICD-10-CM | POA: Diagnosis not present

## 2015-05-28 DIAGNOSIS — M1990 Unspecified osteoarthritis, unspecified site: Secondary | ICD-10-CM | POA: Diagnosis not present

## 2015-05-28 DIAGNOSIS — K219 Gastro-esophageal reflux disease without esophagitis: Secondary | ICD-10-CM | POA: Diagnosis not present

## 2015-05-28 DIAGNOSIS — D34 Benign neoplasm of thyroid gland: Secondary | ICD-10-CM | POA: Diagnosis not present

## 2015-05-28 DIAGNOSIS — Z8249 Family history of ischemic heart disease and other diseases of the circulatory system: Secondary | ICD-10-CM | POA: Diagnosis not present

## 2015-05-28 DIAGNOSIS — Z7984 Long term (current) use of oral hypoglycemic drugs: Secondary | ICD-10-CM | POA: Diagnosis not present

## 2015-05-28 DIAGNOSIS — Z8572 Personal history of non-Hodgkin lymphomas: Secondary | ICD-10-CM | POA: Diagnosis not present

## 2015-05-28 DIAGNOSIS — D440 Neoplasm of uncertain behavior of thyroid gland: Secondary | ICD-10-CM | POA: Diagnosis not present

## 2015-05-28 DIAGNOSIS — Z96659 Presence of unspecified artificial knee joint: Secondary | ICD-10-CM | POA: Diagnosis not present

## 2015-05-28 DIAGNOSIS — Z9049 Acquired absence of other specified parts of digestive tract: Secondary | ICD-10-CM | POA: Diagnosis not present

## 2015-05-28 DIAGNOSIS — E785 Hyperlipidemia, unspecified: Secondary | ICD-10-CM | POA: Diagnosis not present

## 2015-05-28 DIAGNOSIS — Z833 Family history of diabetes mellitus: Secondary | ICD-10-CM | POA: Diagnosis not present

## 2015-05-28 DIAGNOSIS — G709 Myoneural disorder, unspecified: Secondary | ICD-10-CM | POA: Diagnosis not present

## 2015-05-28 DIAGNOSIS — G4730 Sleep apnea, unspecified: Secondary | ICD-10-CM | POA: Diagnosis not present

## 2015-05-29 DIAGNOSIS — D34 Benign neoplasm of thyroid gland: Secondary | ICD-10-CM | POA: Diagnosis not present

## 2015-05-29 DIAGNOSIS — G4730 Sleep apnea, unspecified: Secondary | ICD-10-CM | POA: Diagnosis not present

## 2015-05-29 DIAGNOSIS — K449 Diaphragmatic hernia without obstruction or gangrene: Secondary | ICD-10-CM | POA: Diagnosis not present

## 2015-05-29 DIAGNOSIS — K219 Gastro-esophageal reflux disease without esophagitis: Secondary | ICD-10-CM | POA: Diagnosis not present

## 2015-05-29 DIAGNOSIS — I10 Essential (primary) hypertension: Secondary | ICD-10-CM | POA: Diagnosis not present

## 2015-05-29 DIAGNOSIS — E785 Hyperlipidemia, unspecified: Secondary | ICD-10-CM | POA: Diagnosis not present

## 2015-05-30 DIAGNOSIS — Z95828 Presence of other vascular implants and grafts: Secondary | ICD-10-CM | POA: Diagnosis not present

## 2015-06-12 DIAGNOSIS — M7051 Other bursitis of knee, right knee: Secondary | ICD-10-CM | POA: Diagnosis not present

## 2015-06-26 DIAGNOSIS — E1165 Type 2 diabetes mellitus with hyperglycemia: Secondary | ICD-10-CM | POA: Diagnosis not present

## 2015-06-26 DIAGNOSIS — D519 Vitamin B12 deficiency anemia, unspecified: Secondary | ICD-10-CM | POA: Diagnosis not present

## 2015-06-26 DIAGNOSIS — E7800 Pure hypercholesterolemia, unspecified: Secondary | ICD-10-CM | POA: Diagnosis not present

## 2015-06-26 DIAGNOSIS — C8590 Non-Hodgkin lymphoma, unspecified, unspecified site: Secondary | ICD-10-CM | POA: Diagnosis not present

## 2015-06-26 DIAGNOSIS — E539 Vitamin B deficiency, unspecified: Secondary | ICD-10-CM | POA: Diagnosis not present

## 2015-06-26 DIAGNOSIS — I10 Essential (primary) hypertension: Secondary | ICD-10-CM | POA: Diagnosis not present

## 2015-06-26 DIAGNOSIS — M129 Arthropathy, unspecified: Secondary | ICD-10-CM | POA: Diagnosis not present

## 2015-06-26 DIAGNOSIS — K219 Gastro-esophageal reflux disease without esophagitis: Secondary | ICD-10-CM | POA: Diagnosis not present

## 2015-07-03 DIAGNOSIS — I10 Essential (primary) hypertension: Secondary | ICD-10-CM | POA: Diagnosis not present

## 2015-07-03 DIAGNOSIS — Z0001 Encounter for general adult medical examination with abnormal findings: Secondary | ICD-10-CM | POA: Diagnosis not present

## 2015-07-03 DIAGNOSIS — B0089 Other herpesviral infection: Secondary | ICD-10-CM | POA: Diagnosis not present

## 2015-07-03 DIAGNOSIS — C8590 Non-Hodgkin lymphoma, unspecified, unspecified site: Secondary | ICD-10-CM | POA: Diagnosis not present

## 2015-07-03 DIAGNOSIS — G4730 Sleep apnea, unspecified: Secondary | ICD-10-CM | POA: Diagnosis not present

## 2015-07-03 DIAGNOSIS — K219 Gastro-esophageal reflux disease without esophagitis: Secondary | ICD-10-CM | POA: Diagnosis not present

## 2015-07-03 DIAGNOSIS — E1165 Type 2 diabetes mellitus with hyperglycemia: Secondary | ICD-10-CM | POA: Diagnosis not present

## 2015-07-03 DIAGNOSIS — Z1389 Encounter for screening for other disorder: Secondary | ICD-10-CM | POA: Diagnosis not present

## 2015-07-12 DIAGNOSIS — Z95828 Presence of other vascular implants and grafts: Secondary | ICD-10-CM | POA: Diagnosis not present

## 2015-07-16 DIAGNOSIS — C8333 Diffuse large B-cell lymphoma, intra-abdominal lymph nodes: Secondary | ICD-10-CM | POA: Diagnosis not present

## 2015-07-16 DIAGNOSIS — Z95828 Presence of other vascular implants and grafts: Secondary | ICD-10-CM | POA: Diagnosis not present

## 2015-07-16 DIAGNOSIS — J209 Acute bronchitis, unspecified: Secondary | ICD-10-CM | POA: Diagnosis not present

## 2015-07-16 DIAGNOSIS — E041 Nontoxic single thyroid nodule: Secondary | ICD-10-CM | POA: Diagnosis not present

## 2015-07-16 DIAGNOSIS — Z8572 Personal history of non-Hodgkin lymphomas: Secondary | ICD-10-CM | POA: Diagnosis not present

## 2015-07-20 DIAGNOSIS — E113293 Type 2 diabetes mellitus with mild nonproliferative diabetic retinopathy without macular edema, bilateral: Secondary | ICD-10-CM | POA: Diagnosis not present

## 2015-07-20 DIAGNOSIS — H35373 Puckering of macula, bilateral: Secondary | ICD-10-CM | POA: Diagnosis not present

## 2015-07-20 DIAGNOSIS — H34832 Tributary (branch) retinal vein occlusion, left eye, with macular edema: Secondary | ICD-10-CM | POA: Diagnosis not present

## 2015-07-20 DIAGNOSIS — H3582 Retinal ischemia: Secondary | ICD-10-CM | POA: Diagnosis not present

## 2015-08-01 IMAGING — MG MM SCREENING BREAST TOMO BILATERAL
9 series · 9 of 25 positions shown · non-contrast
Comparison: Previous exam(s).

CLINICAL DATA: Screening.

EXAM:
DIGITAL SCREENING BILATERAL MAMMOGRAM WITH 3D TOMO WITH CAD

[L CC (1 of 2)]
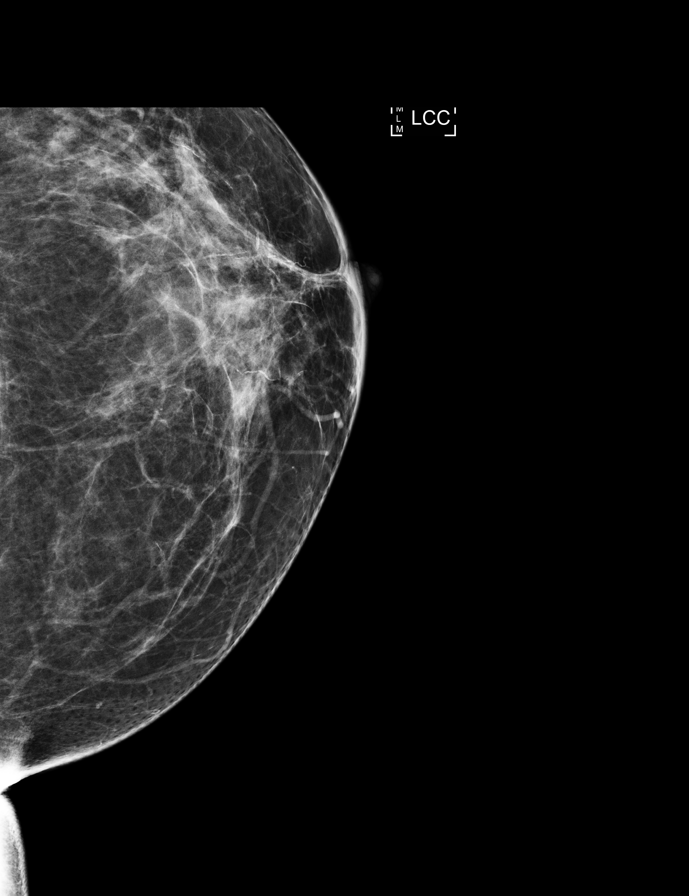

[L CC (2 of 2)]
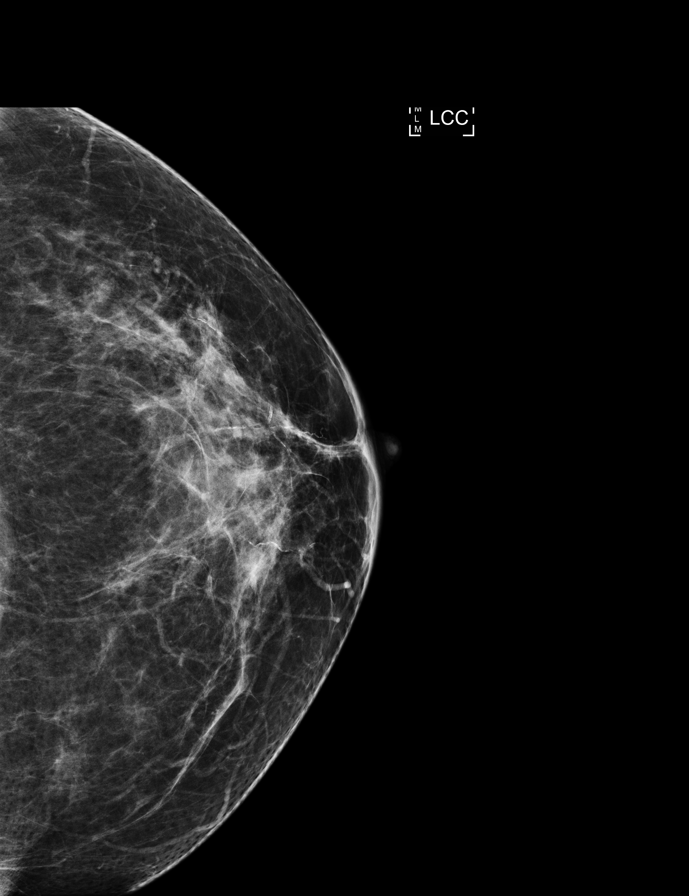

[R MLO]
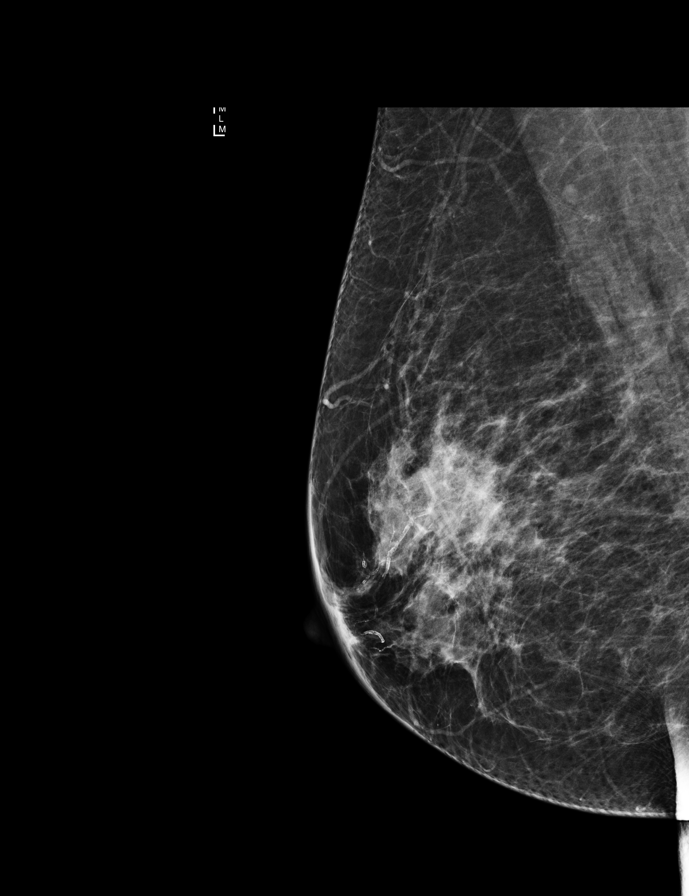

[L MLO]
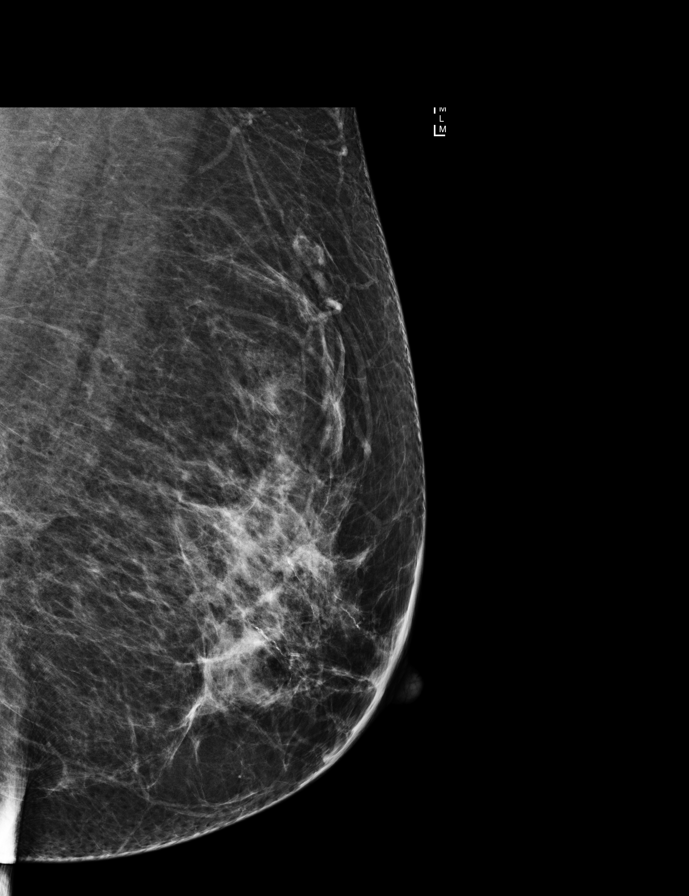

[R CC]
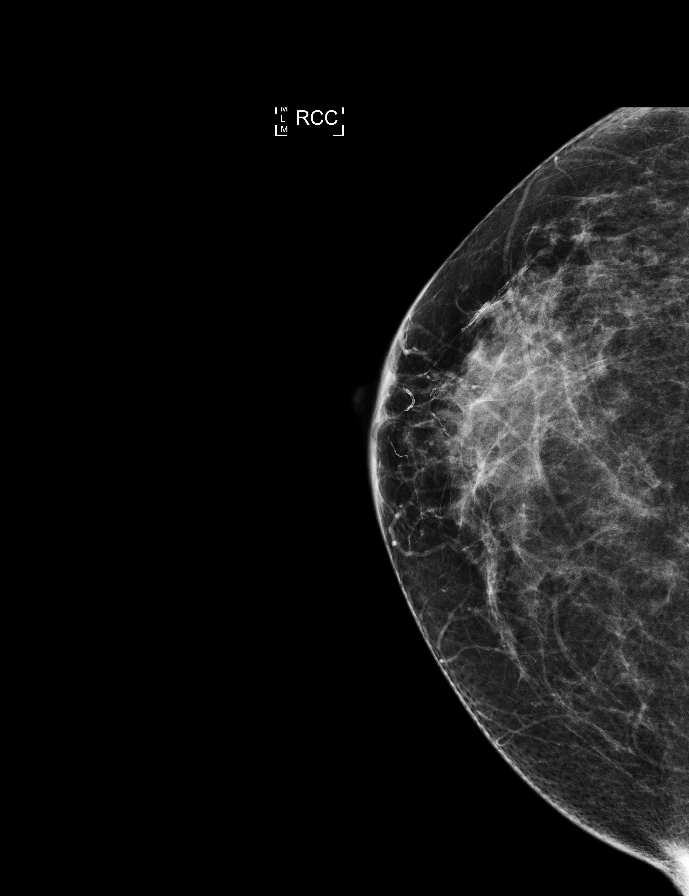

[L CC tomo · tomo slice 32/63.0]
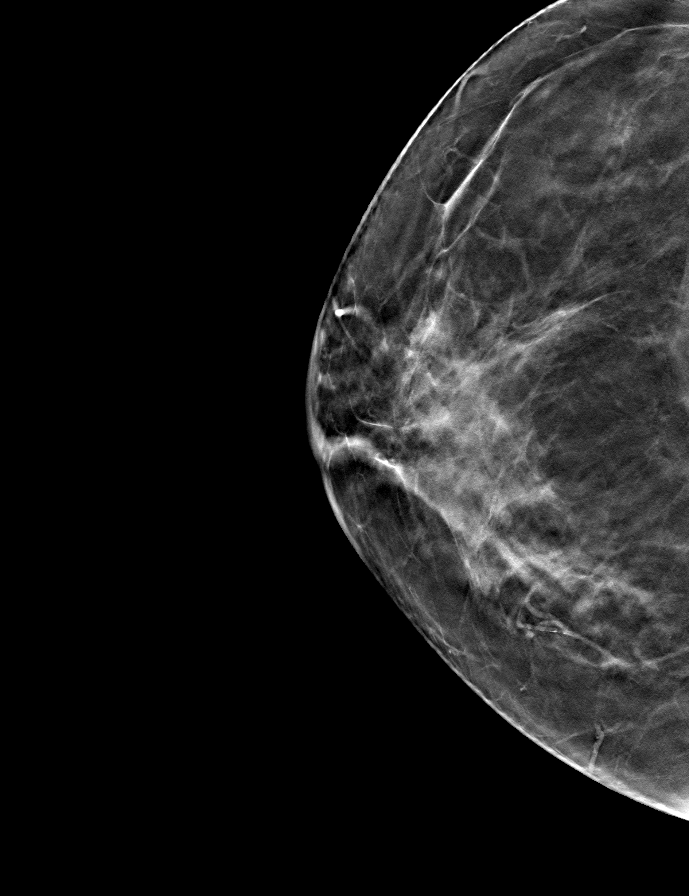

[R MLO tomo · tomo slice 33/66.0]
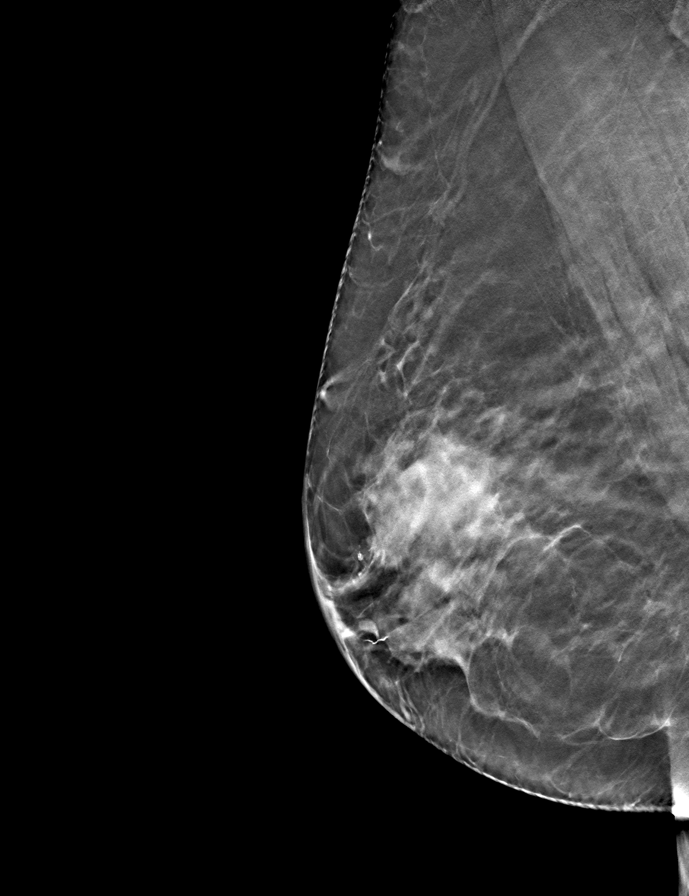

[L MLO tomo · tomo slice 36/71.0]
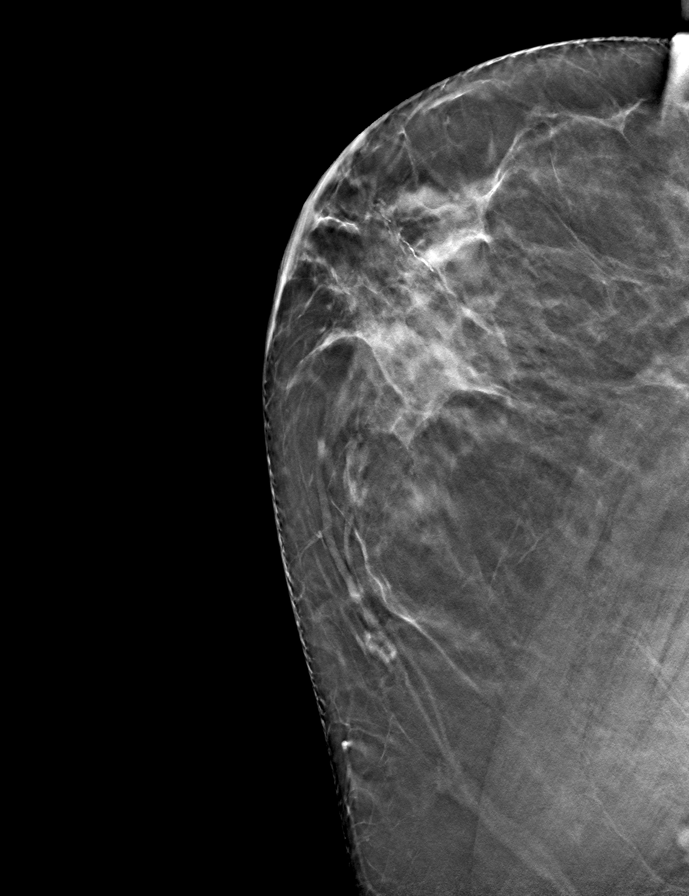

[R CC tomo · tomo slice 31/60.0]
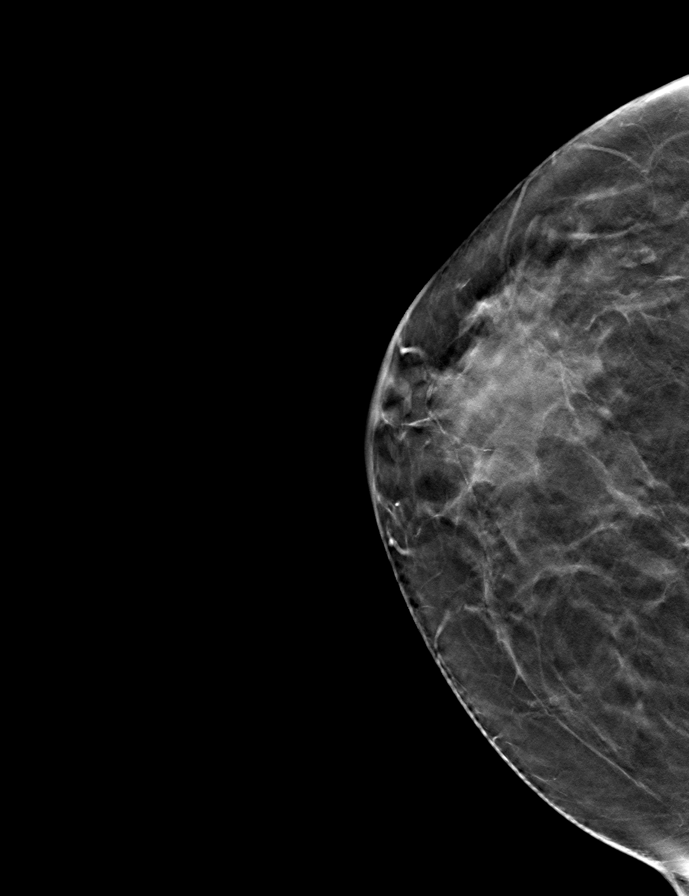

[9 of 25 positions shown; findings below may reference images not displayed]

ACR Breast Density Category b: There are scattered areas of
fibroglandular density.
FINDINGS: There are no findings suspicious for malignancy. Images were
processed with CAD.
IMPRESSION: No mammographic evidence of malignancy. A result letter of this
screening mammogram will be mailed directly to the patient.

RECOMMENDATION:
Screening mammogram in one year. (Code:55-L-23V)

BI-RADS CATEGORY  1: Negative.

## 2015-08-23 DIAGNOSIS — Z95828 Presence of other vascular implants and grafts: Secondary | ICD-10-CM | POA: Diagnosis not present

## 2015-09-06 DIAGNOSIS — E039 Hypothyroidism, unspecified: Secondary | ICD-10-CM | POA: Diagnosis not present

## 2015-09-06 DIAGNOSIS — C8590 Non-Hodgkin lymphoma, unspecified, unspecified site: Secondary | ICD-10-CM | POA: Diagnosis not present

## 2015-10-10 DIAGNOSIS — H3582 Retinal ischemia: Secondary | ICD-10-CM | POA: Diagnosis not present

## 2015-10-10 DIAGNOSIS — H34832 Tributary (branch) retinal vein occlusion, left eye, with macular edema: Secondary | ICD-10-CM | POA: Diagnosis not present

## 2015-10-10 DIAGNOSIS — E113293 Type 2 diabetes mellitus with mild nonproliferative diabetic retinopathy without macular edema, bilateral: Secondary | ICD-10-CM | POA: Diagnosis not present

## 2015-10-10 DIAGNOSIS — H35373 Puckering of macula, bilateral: Secondary | ICD-10-CM | POA: Diagnosis not present

## 2015-10-12 DIAGNOSIS — K449 Diaphragmatic hernia without obstruction or gangrene: Secondary | ICD-10-CM | POA: Diagnosis not present

## 2015-10-12 DIAGNOSIS — C8590 Non-Hodgkin lymphoma, unspecified, unspecified site: Secondary | ICD-10-CM | POA: Diagnosis not present

## 2015-10-12 DIAGNOSIS — C8333 Diffuse large B-cell lymphoma, intra-abdominal lymph nodes: Secondary | ICD-10-CM | POA: Diagnosis not present

## 2015-10-12 DIAGNOSIS — I700 Atherosclerosis of aorta: Secondary | ICD-10-CM | POA: Diagnosis not present

## 2015-10-15 DIAGNOSIS — C8333 Diffuse large B-cell lymphoma, intra-abdominal lymph nodes: Secondary | ICD-10-CM | POA: Diagnosis not present

## 2015-10-15 DIAGNOSIS — E042 Nontoxic multinodular goiter: Secondary | ICD-10-CM | POA: Diagnosis not present

## 2015-10-15 DIAGNOSIS — Z95828 Presence of other vascular implants and grafts: Secondary | ICD-10-CM | POA: Diagnosis not present

## 2015-10-15 DIAGNOSIS — E876 Hypokalemia: Secondary | ICD-10-CM | POA: Diagnosis not present

## 2015-10-15 DIAGNOSIS — D801 Nonfamilial hypogammaglobulinemia: Secondary | ICD-10-CM | POA: Diagnosis not present

## 2015-10-16 DIAGNOSIS — E890 Postprocedural hypothyroidism: Secondary | ICD-10-CM | POA: Diagnosis not present

## 2015-10-16 DIAGNOSIS — E042 Nontoxic multinodular goiter: Secondary | ICD-10-CM | POA: Diagnosis not present

## 2015-10-26 DIAGNOSIS — H43813 Vitreous degeneration, bilateral: Secondary | ICD-10-CM | POA: Diagnosis not present

## 2015-10-26 DIAGNOSIS — E113293 Type 2 diabetes mellitus with mild nonproliferative diabetic retinopathy without macular edema, bilateral: Secondary | ICD-10-CM | POA: Diagnosis not present

## 2015-10-26 DIAGNOSIS — H34832 Tributary (branch) retinal vein occlusion, left eye, with macular edema: Secondary | ICD-10-CM | POA: Diagnosis not present

## 2015-11-01 DIAGNOSIS — Z95828 Presence of other vascular implants and grafts: Secondary | ICD-10-CM | POA: Diagnosis not present

## 2015-11-01 DIAGNOSIS — C8333 Diffuse large B-cell lymphoma, intra-abdominal lymph nodes: Secondary | ICD-10-CM | POA: Diagnosis not present

## 2015-11-06 DIAGNOSIS — H538 Other visual disturbances: Secondary | ICD-10-CM | POA: Diagnosis not present

## 2015-11-06 DIAGNOSIS — H2511 Age-related nuclear cataract, right eye: Secondary | ICD-10-CM | POA: Diagnosis not present

## 2015-11-07 DIAGNOSIS — H34832 Tributary (branch) retinal vein occlusion, left eye, with macular edema: Secondary | ICD-10-CM | POA: Diagnosis not present

## 2015-11-07 DIAGNOSIS — H43813 Vitreous degeneration, bilateral: Secondary | ICD-10-CM | POA: Diagnosis not present

## 2015-11-07 DIAGNOSIS — E113291 Type 2 diabetes mellitus with mild nonproliferative diabetic retinopathy without macular edema, right eye: Secondary | ICD-10-CM | POA: Diagnosis not present

## 2015-11-07 DIAGNOSIS — H3582 Retinal ischemia: Secondary | ICD-10-CM | POA: Diagnosis not present

## 2015-11-13 DIAGNOSIS — N3946 Mixed incontinence: Secondary | ICD-10-CM | POA: Diagnosis not present

## 2015-11-16 ENCOUNTER — Other Ambulatory Visit: Payer: Self-pay | Admitting: Family Medicine

## 2015-11-16 DIAGNOSIS — Z1231 Encounter for screening mammogram for malignant neoplasm of breast: Secondary | ICD-10-CM

## 2015-11-22 DIAGNOSIS — E890 Postprocedural hypothyroidism: Secondary | ICD-10-CM | POA: Diagnosis not present

## 2015-11-22 DIAGNOSIS — E876 Hypokalemia: Secondary | ICD-10-CM | POA: Diagnosis not present

## 2015-11-22 DIAGNOSIS — C8333 Diffuse large B-cell lymphoma, intra-abdominal lymph nodes: Secondary | ICD-10-CM | POA: Diagnosis not present

## 2015-11-22 DIAGNOSIS — Z95828 Presence of other vascular implants and grafts: Secondary | ICD-10-CM | POA: Diagnosis not present

## 2015-11-22 DIAGNOSIS — D801 Nonfamilial hypogammaglobulinemia: Secondary | ICD-10-CM | POA: Diagnosis not present

## 2015-12-03 DIAGNOSIS — G4730 Sleep apnea, unspecified: Secondary | ICD-10-CM | POA: Diagnosis not present

## 2015-12-03 DIAGNOSIS — E039 Hypothyroidism, unspecified: Secondary | ICD-10-CM | POA: Diagnosis not present

## 2015-12-03 DIAGNOSIS — Z7984 Long term (current) use of oral hypoglycemic drugs: Secondary | ICD-10-CM | POA: Diagnosis not present

## 2015-12-03 DIAGNOSIS — E7800 Pure hypercholesterolemia, unspecified: Secondary | ICD-10-CM | POA: Diagnosis not present

## 2015-12-03 DIAGNOSIS — I10 Essential (primary) hypertension: Secondary | ICD-10-CM | POA: Diagnosis not present

## 2015-12-03 DIAGNOSIS — H25041 Posterior subcapsular polar age-related cataract, right eye: Secondary | ICD-10-CM | POA: Diagnosis not present

## 2015-12-03 DIAGNOSIS — F419 Anxiety disorder, unspecified: Secondary | ICD-10-CM | POA: Diagnosis not present

## 2015-12-03 DIAGNOSIS — K219 Gastro-esophageal reflux disease without esophagitis: Secondary | ICD-10-CM | POA: Diagnosis not present

## 2015-12-03 DIAGNOSIS — H34832 Tributary (branch) retinal vein occlusion, left eye, with macular edema: Secondary | ICD-10-CM | POA: Diagnosis not present

## 2015-12-03 DIAGNOSIS — E11311 Type 2 diabetes mellitus with unspecified diabetic retinopathy with macular edema: Secondary | ICD-10-CM | POA: Diagnosis not present

## 2015-12-03 DIAGNOSIS — H524 Presbyopia: Secondary | ICD-10-CM | POA: Diagnosis not present

## 2015-12-03 DIAGNOSIS — Z8572 Personal history of non-Hodgkin lymphomas: Secondary | ICD-10-CM | POA: Diagnosis not present

## 2015-12-03 DIAGNOSIS — H269 Unspecified cataract: Secondary | ICD-10-CM | POA: Diagnosis not present

## 2015-12-03 DIAGNOSIS — H04129 Dry eye syndrome of unspecified lacrimal gland: Secondary | ICD-10-CM | POA: Diagnosis not present

## 2015-12-03 DIAGNOSIS — H538 Other visual disturbances: Secondary | ICD-10-CM | POA: Diagnosis not present

## 2015-12-03 DIAGNOSIS — M1990 Unspecified osteoarthritis, unspecified site: Secondary | ICD-10-CM | POA: Diagnosis not present

## 2015-12-03 DIAGNOSIS — Z79899 Other long term (current) drug therapy: Secondary | ICD-10-CM | POA: Diagnosis not present

## 2015-12-03 DIAGNOSIS — H2513 Age-related nuclear cataract, bilateral: Secondary | ICD-10-CM | POA: Diagnosis not present

## 2015-12-03 DIAGNOSIS — H2511 Age-related nuclear cataract, right eye: Secondary | ICD-10-CM | POA: Diagnosis not present

## 2015-12-03 DIAGNOSIS — H52209 Unspecified astigmatism, unspecified eye: Secondary | ICD-10-CM | POA: Diagnosis not present

## 2015-12-03 DIAGNOSIS — G43909 Migraine, unspecified, not intractable, without status migrainosus: Secondary | ICD-10-CM | POA: Diagnosis not present

## 2015-12-03 DIAGNOSIS — H5210 Myopia, unspecified eye: Secondary | ICD-10-CM | POA: Diagnosis not present

## 2015-12-12 ENCOUNTER — Ambulatory Visit
Admission: RE | Admit: 2015-12-12 | Discharge: 2015-12-12 | Disposition: A | Discharge status: Home / Self Care | Payer: Medicare Other | Source: Ambulatory Visit | Attending: Family Medicine | Admitting: Family Medicine

## 2015-12-12 DIAGNOSIS — R32 Unspecified urinary incontinence: Secondary | ICD-10-CM | POA: Diagnosis not present

## 2015-12-12 DIAGNOSIS — H34832 Tributary (branch) retinal vein occlusion, left eye, with macular edema: Secondary | ICD-10-CM | POA: Diagnosis not present

## 2015-12-12 DIAGNOSIS — Z1231 Encounter for screening mammogram for malignant neoplasm of breast: Secondary | ICD-10-CM | POA: Diagnosis not present

## 2015-12-12 DIAGNOSIS — R351 Nocturia: Secondary | ICD-10-CM | POA: Diagnosis not present

## 2015-12-17 DIAGNOSIS — H34812 Central retinal vein occlusion, left eye, with macular edema: Secondary | ICD-10-CM | POA: Diagnosis not present

## 2015-12-17 DIAGNOSIS — H34232 Retinal artery branch occlusion, left eye: Secondary | ICD-10-CM | POA: Diagnosis not present

## 2015-12-17 DIAGNOSIS — H43813 Vitreous degeneration, bilateral: Secondary | ICD-10-CM | POA: Diagnosis not present

## 2016-01-11 DIAGNOSIS — H34812 Central retinal vein occlusion, left eye, with macular edema: Secondary | ICD-10-CM | POA: Diagnosis not present

## 2016-01-21 DIAGNOSIS — Z95828 Presence of other vascular implants and grafts: Secondary | ICD-10-CM | POA: Diagnosis not present

## 2016-01-21 DIAGNOSIS — Z452 Encounter for adjustment and management of vascular access device: Secondary | ICD-10-CM | POA: Diagnosis not present

## 2016-02-13 DIAGNOSIS — H34812 Central retinal vein occlusion, left eye, with macular edema: Secondary | ICD-10-CM | POA: Diagnosis not present

## 2016-02-13 DIAGNOSIS — H34232 Retinal artery branch occlusion, left eye: Secondary | ICD-10-CM | POA: Diagnosis not present

## 2016-02-21 DIAGNOSIS — M9903 Segmental and somatic dysfunction of lumbar region: Secondary | ICD-10-CM | POA: Diagnosis not present

## 2016-02-21 DIAGNOSIS — M5136 Other intervertebral disc degeneration, lumbar region: Secondary | ICD-10-CM | POA: Diagnosis not present

## 2016-02-25 DIAGNOSIS — M5136 Other intervertebral disc degeneration, lumbar region: Secondary | ICD-10-CM | POA: Diagnosis not present

## 2016-02-25 DIAGNOSIS — M9903 Segmental and somatic dysfunction of lumbar region: Secondary | ICD-10-CM | POA: Diagnosis not present

## 2016-02-27 DIAGNOSIS — M5136 Other intervertebral disc degeneration, lumbar region: Secondary | ICD-10-CM | POA: Diagnosis not present

## 2016-02-27 DIAGNOSIS — M9903 Segmental and somatic dysfunction of lumbar region: Secondary | ICD-10-CM | POA: Diagnosis not present

## 2016-02-28 DIAGNOSIS — M9903 Segmental and somatic dysfunction of lumbar region: Secondary | ICD-10-CM | POA: Diagnosis not present

## 2016-02-28 DIAGNOSIS — M5136 Other intervertebral disc degeneration, lumbar region: Secondary | ICD-10-CM | POA: Diagnosis not present

## 2016-03-03 DIAGNOSIS — M5136 Other intervertebral disc degeneration, lumbar region: Secondary | ICD-10-CM | POA: Diagnosis not present

## 2016-03-03 DIAGNOSIS — M9903 Segmental and somatic dysfunction of lumbar region: Secondary | ICD-10-CM | POA: Diagnosis not present

## 2016-03-05 DIAGNOSIS — M9903 Segmental and somatic dysfunction of lumbar region: Secondary | ICD-10-CM | POA: Diagnosis not present

## 2016-03-05 DIAGNOSIS — M5136 Other intervertebral disc degeneration, lumbar region: Secondary | ICD-10-CM | POA: Diagnosis not present

## 2016-03-06 DIAGNOSIS — M9903 Segmental and somatic dysfunction of lumbar region: Secondary | ICD-10-CM | POA: Diagnosis not present

## 2016-03-06 DIAGNOSIS — M5136 Other intervertebral disc degeneration, lumbar region: Secondary | ICD-10-CM | POA: Diagnosis not present

## 2016-03-10 DIAGNOSIS — M5136 Other intervertebral disc degeneration, lumbar region: Secondary | ICD-10-CM | POA: Diagnosis not present

## 2016-03-10 DIAGNOSIS — M9903 Segmental and somatic dysfunction of lumbar region: Secondary | ICD-10-CM | POA: Diagnosis not present

## 2016-03-12 DIAGNOSIS — M9903 Segmental and somatic dysfunction of lumbar region: Secondary | ICD-10-CM | POA: Diagnosis not present

## 2016-03-12 DIAGNOSIS — M5136 Other intervertebral disc degeneration, lumbar region: Secondary | ICD-10-CM | POA: Diagnosis not present

## 2016-03-13 DIAGNOSIS — M5136 Other intervertebral disc degeneration, lumbar region: Secondary | ICD-10-CM | POA: Diagnosis not present

## 2016-03-13 DIAGNOSIS — M9903 Segmental and somatic dysfunction of lumbar region: Secondary | ICD-10-CM | POA: Diagnosis not present

## 2016-03-17 DIAGNOSIS — M5136 Other intervertebral disc degeneration, lumbar region: Secondary | ICD-10-CM | POA: Diagnosis not present

## 2016-03-17 DIAGNOSIS — M9903 Segmental and somatic dysfunction of lumbar region: Secondary | ICD-10-CM | POA: Diagnosis not present

## 2016-04-16 DIAGNOSIS — H34812 Central retinal vein occlusion, left eye, with macular edema: Secondary | ICD-10-CM | POA: Diagnosis not present

## 2016-04-16 DIAGNOSIS — H34232 Retinal artery branch occlusion, left eye: Secondary | ICD-10-CM | POA: Diagnosis not present

## 2016-04-18 DIAGNOSIS — I700 Atherosclerosis of aorta: Secondary | ICD-10-CM | POA: Diagnosis not present

## 2016-04-18 DIAGNOSIS — R918 Other nonspecific abnormal finding of lung field: Secondary | ICD-10-CM | POA: Diagnosis not present

## 2016-04-18 DIAGNOSIS — K7689 Other specified diseases of liver: Secondary | ICD-10-CM | POA: Diagnosis not present

## 2016-04-18 DIAGNOSIS — K449 Diaphragmatic hernia without obstruction or gangrene: Secondary | ICD-10-CM | POA: Diagnosis not present

## 2016-04-18 DIAGNOSIS — C8200 Follicular lymphoma grade I, unspecified site: Secondary | ICD-10-CM | POA: Diagnosis not present

## 2016-04-22 DIAGNOSIS — E890 Postprocedural hypothyroidism: Secondary | ICD-10-CM | POA: Diagnosis not present

## 2016-04-22 DIAGNOSIS — I10 Essential (primary) hypertension: Secondary | ICD-10-CM | POA: Diagnosis not present

## 2016-04-23 DIAGNOSIS — C8200 Follicular lymphoma grade I, unspecified site: Secondary | ICD-10-CM | POA: Diagnosis not present

## 2016-04-23 DIAGNOSIS — C8590 Non-Hodgkin lymphoma, unspecified, unspecified site: Secondary | ICD-10-CM | POA: Diagnosis not present

## 2016-05-29 DIAGNOSIS — Z452 Encounter for adjustment and management of vascular access device: Secondary | ICD-10-CM | POA: Diagnosis not present

## 2016-05-29 DIAGNOSIS — C8200 Follicular lymphoma grade I, unspecified site: Secondary | ICD-10-CM | POA: Diagnosis not present

## 2016-07-02 DIAGNOSIS — R351 Nocturia: Secondary | ICD-10-CM | POA: Diagnosis not present

## 2016-07-02 DIAGNOSIS — Z87440 Personal history of urinary (tract) infections: Secondary | ICD-10-CM | POA: Diagnosis not present

## 2016-07-02 DIAGNOSIS — N3946 Mixed incontinence: Secondary | ICD-10-CM | POA: Diagnosis not present

## 2016-07-09 DIAGNOSIS — Z6834 Body mass index (BMI) 34.0-34.9, adult: Secondary | ICD-10-CM | POA: Diagnosis not present

## 2016-07-09 DIAGNOSIS — E7800 Pure hypercholesterolemia, unspecified: Secondary | ICD-10-CM | POA: Diagnosis not present

## 2016-07-09 DIAGNOSIS — F3289 Other specified depressive episodes: Secondary | ICD-10-CM | POA: Diagnosis not present

## 2016-07-09 DIAGNOSIS — D519 Vitamin B12 deficiency anemia, unspecified: Secondary | ICD-10-CM | POA: Diagnosis not present

## 2016-07-09 DIAGNOSIS — E1165 Type 2 diabetes mellitus with hyperglycemia: Secondary | ICD-10-CM | POA: Diagnosis not present

## 2016-07-09 DIAGNOSIS — C8590 Non-Hodgkin lymphoma, unspecified, unspecified site: Secondary | ICD-10-CM | POA: Diagnosis not present

## 2016-07-09 DIAGNOSIS — I10 Essential (primary) hypertension: Secondary | ICD-10-CM | POA: Diagnosis not present

## 2016-07-09 DIAGNOSIS — Z0001 Encounter for general adult medical examination with abnormal findings: Secondary | ICD-10-CM | POA: Diagnosis not present

## 2016-07-09 DIAGNOSIS — M129 Arthropathy, unspecified: Secondary | ICD-10-CM | POA: Diagnosis not present

## 2016-07-09 DIAGNOSIS — K219 Gastro-esophageal reflux disease without esophagitis: Secondary | ICD-10-CM | POA: Diagnosis not present

## 2016-07-09 DIAGNOSIS — G4730 Sleep apnea, unspecified: Secondary | ICD-10-CM | POA: Diagnosis not present

## 2016-07-09 DIAGNOSIS — Z23 Encounter for immunization: Secondary | ICD-10-CM | POA: Diagnosis not present

## 2016-07-17 DIAGNOSIS — J1000 Influenza due to other identified influenza virus with unspecified type of pneumonia: Secondary | ICD-10-CM | POA: Diagnosis not present

## 2016-07-17 DIAGNOSIS — Z6835 Body mass index (BMI) 35.0-35.9, adult: Secondary | ICD-10-CM | POA: Diagnosis not present

## 2016-08-15 DIAGNOSIS — H3582 Retinal ischemia: Secondary | ICD-10-CM | POA: Diagnosis not present

## 2016-08-15 DIAGNOSIS — H211X2 Other vascular disorders of iris and ciliary body, left eye: Secondary | ICD-10-CM | POA: Diagnosis not present

## 2016-08-15 DIAGNOSIS — H348121 Central retinal vein occlusion, left eye, with retinal neovascularization: Secondary | ICD-10-CM | POA: Diagnosis not present

## 2016-08-15 DIAGNOSIS — H34232 Retinal artery branch occlusion, left eye: Secondary | ICD-10-CM | POA: Diagnosis not present

## 2016-08-22 DIAGNOSIS — H211X2 Other vascular disorders of iris and ciliary body, left eye: Secondary | ICD-10-CM | POA: Diagnosis not present

## 2016-08-22 DIAGNOSIS — H348121 Central retinal vein occlusion, left eye, with retinal neovascularization: Secondary | ICD-10-CM | POA: Diagnosis not present

## 2016-08-22 DIAGNOSIS — H3582 Retinal ischemia: Secondary | ICD-10-CM | POA: Diagnosis not present

## 2016-08-26 DIAGNOSIS — H211X2 Other vascular disorders of iris and ciliary body, left eye: Secondary | ICD-10-CM | POA: Diagnosis not present

## 2016-08-26 DIAGNOSIS — H348121 Central retinal vein occlusion, left eye, with retinal neovascularization: Secondary | ICD-10-CM | POA: Diagnosis not present

## 2016-09-23 DIAGNOSIS — H3582 Retinal ischemia: Secondary | ICD-10-CM | POA: Diagnosis not present

## 2016-09-23 DIAGNOSIS — H34812 Central retinal vein occlusion, left eye, with macular edema: Secondary | ICD-10-CM | POA: Diagnosis not present

## 2016-09-23 DIAGNOSIS — H211X2 Other vascular disorders of iris and ciliary body, left eye: Secondary | ICD-10-CM | POA: Diagnosis not present

## 2016-09-23 DIAGNOSIS — H35371 Puckering of macula, right eye: Secondary | ICD-10-CM | POA: Diagnosis not present

## 2016-09-30 DIAGNOSIS — M545 Low back pain: Secondary | ICD-10-CM | POA: Diagnosis not present

## 2016-09-30 DIAGNOSIS — M1711 Unilateral primary osteoarthritis, right knee: Secondary | ICD-10-CM | POA: Diagnosis not present

## 2016-09-30 DIAGNOSIS — M7061 Trochanteric bursitis, right hip: Secondary | ICD-10-CM | POA: Diagnosis not present

## 2016-09-30 DIAGNOSIS — M7062 Trochanteric bursitis, left hip: Secondary | ICD-10-CM | POA: Diagnosis not present

## 2016-10-09 DIAGNOSIS — H524 Presbyopia: Secondary | ICD-10-CM | POA: Diagnosis not present

## 2016-10-09 DIAGNOSIS — H26491 Other secondary cataract, right eye: Secondary | ICD-10-CM | POA: Diagnosis not present

## 2016-10-09 DIAGNOSIS — H348122 Central retinal vein occlusion, left eye, stable: Secondary | ICD-10-CM | POA: Diagnosis not present

## 2016-10-09 DIAGNOSIS — H401123 Primary open-angle glaucoma, left eye, severe stage: Secondary | ICD-10-CM | POA: Diagnosis not present

## 2016-10-14 DIAGNOSIS — C8207 Follicular lymphoma grade I, spleen: Secondary | ICD-10-CM | POA: Diagnosis not present

## 2016-10-16 DIAGNOSIS — N281 Cyst of kidney, acquired: Secondary | ICD-10-CM | POA: Diagnosis not present

## 2016-10-16 DIAGNOSIS — K7689 Other specified diseases of liver: Secondary | ICD-10-CM | POA: Diagnosis not present

## 2016-10-16 DIAGNOSIS — Q278 Other specified congenital malformations of peripheral vascular system: Secondary | ICD-10-CM | POA: Diagnosis not present

## 2016-10-16 DIAGNOSIS — R918 Other nonspecific abnormal finding of lung field: Secondary | ICD-10-CM | POA: Diagnosis not present

## 2016-10-16 DIAGNOSIS — C8200 Follicular lymphoma grade I, unspecified site: Secondary | ICD-10-CM | POA: Diagnosis not present

## 2016-10-16 DIAGNOSIS — C8518 Unspecified B-cell lymphoma, lymph nodes of multiple sites: Secondary | ICD-10-CM | POA: Diagnosis not present

## 2016-10-22 DIAGNOSIS — Z08 Encounter for follow-up examination after completed treatment for malignant neoplasm: Secondary | ICD-10-CM | POA: Diagnosis not present

## 2016-10-22 DIAGNOSIS — Z8572 Personal history of non-Hodgkin lymphomas: Secondary | ICD-10-CM | POA: Diagnosis not present

## 2016-10-22 DIAGNOSIS — C8207 Follicular lymphoma grade I, spleen: Secondary | ICD-10-CM | POA: Diagnosis not present

## 2016-11-19 DIAGNOSIS — H34812 Central retinal vein occlusion, left eye, with macular edema: Secondary | ICD-10-CM | POA: Diagnosis not present

## 2016-11-19 DIAGNOSIS — H3562 Retinal hemorrhage, left eye: Secondary | ICD-10-CM | POA: Diagnosis not present

## 2016-11-19 DIAGNOSIS — H35371 Puckering of macula, right eye: Secondary | ICD-10-CM | POA: Diagnosis not present

## 2016-11-19 DIAGNOSIS — H3582 Retinal ischemia: Secondary | ICD-10-CM | POA: Diagnosis not present

## 2016-12-31 DIAGNOSIS — N3946 Mixed incontinence: Secondary | ICD-10-CM | POA: Diagnosis not present

## 2016-12-31 DIAGNOSIS — R351 Nocturia: Secondary | ICD-10-CM | POA: Diagnosis not present

## 2016-12-31 DIAGNOSIS — R32 Unspecified urinary incontinence: Secondary | ICD-10-CM | POA: Diagnosis not present

## 2017-02-05 DIAGNOSIS — J189 Pneumonia, unspecified organism: Secondary | ICD-10-CM | POA: Diagnosis not present

## 2017-02-05 DIAGNOSIS — R05 Cough: Secondary | ICD-10-CM | POA: Diagnosis not present

## 2017-02-05 DIAGNOSIS — Z6835 Body mass index (BMI) 35.0-35.9, adult: Secondary | ICD-10-CM | POA: Diagnosis not present

## 2017-02-11 DIAGNOSIS — H211X2 Other vascular disorders of iris and ciliary body, left eye: Secondary | ICD-10-CM | POA: Diagnosis not present

## 2017-02-11 DIAGNOSIS — H34232 Retinal artery branch occlusion, left eye: Secondary | ICD-10-CM | POA: Diagnosis not present

## 2017-02-11 DIAGNOSIS — H34812 Central retinal vein occlusion, left eye, with macular edema: Secondary | ICD-10-CM | POA: Diagnosis not present

## 2017-02-11 DIAGNOSIS — H3582 Retinal ischemia: Secondary | ICD-10-CM | POA: Diagnosis not present

## 2017-04-15 DIAGNOSIS — R351 Nocturia: Secondary | ICD-10-CM | POA: Diagnosis not present

## 2017-04-15 DIAGNOSIS — N3946 Mixed incontinence: Secondary | ICD-10-CM | POA: Diagnosis not present

## 2017-04-20 DIAGNOSIS — I10 Essential (primary) hypertension: Secondary | ICD-10-CM | POA: Diagnosis not present

## 2017-04-20 DIAGNOSIS — E042 Nontoxic multinodular goiter: Secondary | ICD-10-CM | POA: Diagnosis not present

## 2017-04-20 DIAGNOSIS — E890 Postprocedural hypothyroidism: Secondary | ICD-10-CM | POA: Diagnosis not present

## 2017-05-29 DIAGNOSIS — H35373 Puckering of macula, bilateral: Secondary | ICD-10-CM | POA: Diagnosis not present

## 2017-05-29 DIAGNOSIS — H34232 Retinal artery branch occlusion, left eye: Secondary | ICD-10-CM | POA: Diagnosis not present

## 2017-05-29 DIAGNOSIS — H34812 Central retinal vein occlusion, left eye, with macular edema: Secondary | ICD-10-CM | POA: Diagnosis not present

## 2017-05-29 DIAGNOSIS — H3582 Retinal ischemia: Secondary | ICD-10-CM | POA: Diagnosis not present

## 2017-06-03 DIAGNOSIS — J069 Acute upper respiratory infection, unspecified: Secondary | ICD-10-CM | POA: Diagnosis not present

## 2017-06-03 DIAGNOSIS — Z6835 Body mass index (BMI) 35.0-35.9, adult: Secondary | ICD-10-CM | POA: Diagnosis not present

## 2017-06-03 DIAGNOSIS — R05 Cough: Secondary | ICD-10-CM | POA: Diagnosis not present

## 2017-07-15 DIAGNOSIS — K219 Gastro-esophageal reflux disease without esophagitis: Secondary | ICD-10-CM | POA: Diagnosis not present

## 2017-07-15 DIAGNOSIS — D519 Vitamin B12 deficiency anemia, unspecified: Secondary | ICD-10-CM | POA: Diagnosis not present

## 2017-07-15 DIAGNOSIS — E1165 Type 2 diabetes mellitus with hyperglycemia: Secondary | ICD-10-CM | POA: Diagnosis not present

## 2017-07-15 DIAGNOSIS — E7800 Pure hypercholesterolemia, unspecified: Secondary | ICD-10-CM | POA: Diagnosis not present

## 2017-07-15 DIAGNOSIS — F3289 Other specified depressive episodes: Secondary | ICD-10-CM | POA: Diagnosis not present

## 2017-07-15 DIAGNOSIS — I10 Essential (primary) hypertension: Secondary | ICD-10-CM | POA: Diagnosis not present

## 2017-07-16 DIAGNOSIS — E1165 Type 2 diabetes mellitus with hyperglycemia: Secondary | ICD-10-CM | POA: Diagnosis not present

## 2017-07-21 DIAGNOSIS — Z6835 Body mass index (BMI) 35.0-35.9, adult: Secondary | ICD-10-CM | POA: Diagnosis not present

## 2017-07-21 DIAGNOSIS — Z0001 Encounter for general adult medical examination with abnormal findings: Secondary | ICD-10-CM | POA: Diagnosis not present

## 2017-07-21 DIAGNOSIS — E1165 Type 2 diabetes mellitus with hyperglycemia: Secondary | ICD-10-CM | POA: Diagnosis not present

## 2017-07-21 DIAGNOSIS — C8590 Non-Hodgkin lymphoma, unspecified, unspecified site: Secondary | ICD-10-CM | POA: Diagnosis not present

## 2017-08-19 DIAGNOSIS — H348122 Central retinal vein occlusion, left eye, stable: Secondary | ICD-10-CM | POA: Diagnosis not present

## 2017-08-19 DIAGNOSIS — H34232 Retinal artery branch occlusion, left eye: Secondary | ICD-10-CM | POA: Diagnosis not present

## 2017-08-19 DIAGNOSIS — H35373 Puckering of macula, bilateral: Secondary | ICD-10-CM | POA: Diagnosis not present

## 2017-08-19 DIAGNOSIS — H35031 Hypertensive retinopathy, right eye: Secondary | ICD-10-CM | POA: Diagnosis not present

## 2017-10-07 DIAGNOSIS — R351 Nocturia: Secondary | ICD-10-CM | POA: Diagnosis not present

## 2017-10-07 DIAGNOSIS — R300 Dysuria: Secondary | ICD-10-CM | POA: Diagnosis not present

## 2017-10-07 DIAGNOSIS — N3946 Mixed incontinence: Secondary | ICD-10-CM | POA: Diagnosis not present

## 2017-11-14 DIAGNOSIS — F419 Anxiety disorder, unspecified: Secondary | ICD-10-CM | POA: Diagnosis not present

## 2017-11-14 DIAGNOSIS — M4186 Other forms of scoliosis, lumbar region: Secondary | ICD-10-CM | POA: Diagnosis not present

## 2017-11-14 DIAGNOSIS — Z8589 Personal history of malignant neoplasm of other organs and systems: Secondary | ICD-10-CM | POA: Diagnosis not present

## 2017-11-14 DIAGNOSIS — Z7984 Long term (current) use of oral hypoglycemic drugs: Secondary | ICD-10-CM | POA: Diagnosis not present

## 2017-11-14 DIAGNOSIS — G44309 Post-traumatic headache, unspecified, not intractable: Secondary | ICD-10-CM | POA: Diagnosis not present

## 2017-11-14 DIAGNOSIS — Z8572 Personal history of non-Hodgkin lymphomas: Secondary | ICD-10-CM | POA: Diagnosis not present

## 2017-11-14 DIAGNOSIS — K219 Gastro-esophageal reflux disease without esophagitis: Secondary | ICD-10-CM | POA: Diagnosis not present

## 2017-11-14 DIAGNOSIS — M545 Low back pain: Secondary | ICD-10-CM | POA: Diagnosis not present

## 2017-11-14 DIAGNOSIS — S39012A Strain of muscle, fascia and tendon of lower back, initial encounter: Secondary | ICD-10-CM | POA: Diagnosis not present

## 2017-11-14 DIAGNOSIS — E119 Type 2 diabetes mellitus without complications: Secondary | ICD-10-CM | POA: Diagnosis not present

## 2017-11-14 DIAGNOSIS — S0990XA Unspecified injury of head, initial encounter: Secondary | ICD-10-CM | POA: Diagnosis not present

## 2017-11-14 DIAGNOSIS — Z79899 Other long term (current) drug therapy: Secondary | ICD-10-CM | POA: Diagnosis not present

## 2017-11-14 DIAGNOSIS — E079 Disorder of thyroid, unspecified: Secondary | ICD-10-CM | POA: Diagnosis not present

## 2017-11-14 DIAGNOSIS — E785 Hyperlipidemia, unspecified: Secondary | ICD-10-CM | POA: Diagnosis not present

## 2017-11-14 DIAGNOSIS — S0093XA Contusion of unspecified part of head, initial encounter: Secondary | ICD-10-CM | POA: Diagnosis not present

## 2017-11-24 DIAGNOSIS — Z9181 History of falling: Secondary | ICD-10-CM | POA: Diagnosis not present

## 2017-11-24 DIAGNOSIS — I10 Essential (primary) hypertension: Secondary | ICD-10-CM | POA: Diagnosis not present

## 2017-11-24 DIAGNOSIS — G4730 Sleep apnea, unspecified: Secondary | ICD-10-CM | POA: Diagnosis not present

## 2017-11-24 DIAGNOSIS — Z8572 Personal history of non-Hodgkin lymphomas: Secondary | ICD-10-CM | POA: Diagnosis not present

## 2017-11-24 DIAGNOSIS — E1169 Type 2 diabetes mellitus with other specified complication: Secondary | ICD-10-CM | POA: Diagnosis not present

## 2017-11-24 DIAGNOSIS — Z08 Encounter for follow-up examination after completed treatment for malignant neoplasm: Secondary | ICD-10-CM | POA: Diagnosis not present

## 2017-11-24 DIAGNOSIS — M1990 Unspecified osteoarthritis, unspecified site: Secondary | ICD-10-CM | POA: Diagnosis not present

## 2017-11-24 DIAGNOSIS — E785 Hyperlipidemia, unspecified: Secondary | ICD-10-CM | POA: Diagnosis not present

## 2017-11-25 DIAGNOSIS — H35031 Hypertensive retinopathy, right eye: Secondary | ICD-10-CM | POA: Diagnosis not present

## 2017-11-25 DIAGNOSIS — H348122 Central retinal vein occlusion, left eye, stable: Secondary | ICD-10-CM | POA: Diagnosis not present

## 2017-11-25 DIAGNOSIS — H34232 Retinal artery branch occlusion, left eye: Secondary | ICD-10-CM | POA: Diagnosis not present

## 2017-11-25 DIAGNOSIS — H35373 Puckering of macula, bilateral: Secondary | ICD-10-CM | POA: Diagnosis not present

## 2018-01-07 DIAGNOSIS — H4052X3 Glaucoma secondary to other eye disorders, left eye, severe stage: Secondary | ICD-10-CM | POA: Diagnosis not present

## 2018-01-07 DIAGNOSIS — H35373 Puckering of macula, bilateral: Secondary | ICD-10-CM | POA: Diagnosis not present

## 2018-01-07 DIAGNOSIS — H348122 Central retinal vein occlusion, left eye, stable: Secondary | ICD-10-CM | POA: Diagnosis not present

## 2018-01-07 DIAGNOSIS — Z961 Presence of intraocular lens: Secondary | ICD-10-CM | POA: Diagnosis not present

## 2018-01-07 DIAGNOSIS — H401123 Primary open-angle glaucoma, left eye, severe stage: Secondary | ICD-10-CM | POA: Diagnosis not present

## 2018-01-07 DIAGNOSIS — H524 Presbyopia: Secondary | ICD-10-CM | POA: Diagnosis not present

## 2018-01-07 DIAGNOSIS — H34232 Retinal artery branch occlusion, left eye: Secondary | ICD-10-CM | POA: Diagnosis not present

## 2018-01-07 DIAGNOSIS — H348121 Central retinal vein occlusion, left eye, with retinal neovascularization: Secondary | ICD-10-CM | POA: Diagnosis not present

## 2018-01-07 DIAGNOSIS — H2512 Age-related nuclear cataract, left eye: Secondary | ICD-10-CM | POA: Diagnosis not present

## 2018-01-11 DIAGNOSIS — H4052X3 Glaucoma secondary to other eye disorders, left eye, severe stage: Secondary | ICD-10-CM | POA: Diagnosis not present

## 2018-01-11 DIAGNOSIS — H34232 Retinal artery branch occlusion, left eye: Secondary | ICD-10-CM | POA: Diagnosis not present

## 2018-01-11 DIAGNOSIS — H348121 Central retinal vein occlusion, left eye, with retinal neovascularization: Secondary | ICD-10-CM | POA: Diagnosis not present

## 2018-01-11 DIAGNOSIS — H35373 Puckering of macula, bilateral: Secondary | ICD-10-CM | POA: Diagnosis not present

## 2018-01-13 DIAGNOSIS — H25812 Combined forms of age-related cataract, left eye: Secondary | ICD-10-CM | POA: Diagnosis not present

## 2018-01-13 DIAGNOSIS — H40001 Preglaucoma, unspecified, right eye: Secondary | ICD-10-CM | POA: Diagnosis not present

## 2018-01-13 DIAGNOSIS — H35373 Puckering of macula, bilateral: Secondary | ICD-10-CM | POA: Diagnosis not present

## 2018-01-13 DIAGNOSIS — H35032 Hypertensive retinopathy, left eye: Secondary | ICD-10-CM | POA: Diagnosis not present

## 2018-01-13 DIAGNOSIS — E113293 Type 2 diabetes mellitus with mild nonproliferative diabetic retinopathy without macular edema, bilateral: Secondary | ICD-10-CM | POA: Diagnosis not present

## 2018-02-01 DIAGNOSIS — H40052 Ocular hypertension, left eye: Secondary | ICD-10-CM | POA: Diagnosis not present

## 2018-02-01 DIAGNOSIS — K219 Gastro-esophageal reflux disease without esophagitis: Secondary | ICD-10-CM | POA: Diagnosis not present

## 2018-02-01 DIAGNOSIS — Z79899 Other long term (current) drug therapy: Secondary | ICD-10-CM | POA: Diagnosis not present

## 2018-02-01 DIAGNOSIS — F329 Major depressive disorder, single episode, unspecified: Secondary | ICD-10-CM | POA: Diagnosis not present

## 2018-02-01 DIAGNOSIS — F419 Anxiety disorder, unspecified: Secondary | ICD-10-CM | POA: Diagnosis not present

## 2018-02-01 DIAGNOSIS — E7800 Pure hypercholesterolemia, unspecified: Secondary | ICD-10-CM | POA: Diagnosis not present

## 2018-02-01 DIAGNOSIS — H5462 Unqualified visual loss, left eye, normal vision right eye: Secondary | ICD-10-CM | POA: Diagnosis not present

## 2018-02-01 DIAGNOSIS — H35373 Puckering of macula, bilateral: Secondary | ICD-10-CM | POA: Diagnosis not present

## 2018-02-01 DIAGNOSIS — Z85850 Personal history of malignant neoplasm of thyroid: Secondary | ICD-10-CM | POA: Diagnosis not present

## 2018-02-01 DIAGNOSIS — Z7984 Long term (current) use of oral hypoglycemic drugs: Secondary | ICD-10-CM | POA: Diagnosis not present

## 2018-02-01 DIAGNOSIS — H40001 Preglaucoma, unspecified, right eye: Secondary | ICD-10-CM | POA: Diagnosis not present

## 2018-02-01 DIAGNOSIS — E113293 Type 2 diabetes mellitus with mild nonproliferative diabetic retinopathy without macular edema, bilateral: Secondary | ICD-10-CM | POA: Diagnosis not present

## 2018-02-01 DIAGNOSIS — E1136 Type 2 diabetes mellitus with diabetic cataract: Secondary | ICD-10-CM | POA: Diagnosis not present

## 2018-02-01 DIAGNOSIS — H35032 Hypertensive retinopathy, left eye: Secondary | ICD-10-CM | POA: Diagnosis not present

## 2018-02-01 DIAGNOSIS — H25812 Combined forms of age-related cataract, left eye: Secondary | ICD-10-CM | POA: Diagnosis not present

## 2018-02-01 DIAGNOSIS — G4730 Sleep apnea, unspecified: Secondary | ICD-10-CM | POA: Diagnosis not present

## 2018-02-01 DIAGNOSIS — I10 Essential (primary) hypertension: Secondary | ICD-10-CM | POA: Diagnosis not present

## 2018-02-12 DIAGNOSIS — H4052X3 Glaucoma secondary to other eye disorders, left eye, severe stage: Secondary | ICD-10-CM | POA: Diagnosis not present

## 2018-02-12 DIAGNOSIS — H34812 Central retinal vein occlusion, left eye, with macular edema: Secondary | ICD-10-CM | POA: Diagnosis not present

## 2018-02-12 DIAGNOSIS — H34232 Retinal artery branch occlusion, left eye: Secondary | ICD-10-CM | POA: Diagnosis not present

## 2018-02-12 DIAGNOSIS — H211X2 Other vascular disorders of iris and ciliary body, left eye: Secondary | ICD-10-CM | POA: Diagnosis not present

## 2018-03-07 DIAGNOSIS — I10 Essential (primary) hypertension: Secondary | ICD-10-CM | POA: Diagnosis present

## 2018-03-07 DIAGNOSIS — B9620 Unspecified Escherichia coli [E. coli] as the cause of diseases classified elsewhere: Secondary | ICD-10-CM | POA: Diagnosis present

## 2018-03-07 DIAGNOSIS — Z6835 Body mass index (BMI) 35.0-35.9, adult: Secondary | ICD-10-CM | POA: Diagnosis not present

## 2018-03-07 DIAGNOSIS — N12 Tubulo-interstitial nephritis, not specified as acute or chronic: Secondary | ICD-10-CM | POA: Diagnosis not present

## 2018-03-07 DIAGNOSIS — Z87440 Personal history of urinary (tract) infections: Secondary | ICD-10-CM | POA: Diagnosis not present

## 2018-03-07 DIAGNOSIS — E669 Obesity, unspecified: Secondary | ICD-10-CM | POA: Diagnosis present

## 2018-03-07 DIAGNOSIS — C8580 Other specified types of non-Hodgkin lymphoma, unspecified site: Secondary | ICD-10-CM | POA: Diagnosis not present

## 2018-03-07 DIAGNOSIS — E039 Hypothyroidism, unspecified: Secondary | ICD-10-CM | POA: Diagnosis present

## 2018-03-07 DIAGNOSIS — K219 Gastro-esophageal reflux disease without esophagitis: Secondary | ICD-10-CM | POA: Diagnosis present

## 2018-03-07 DIAGNOSIS — H547 Unspecified visual loss: Secondary | ICD-10-CM | POA: Diagnosis present

## 2018-03-07 DIAGNOSIS — Z8572 Personal history of non-Hodgkin lymphomas: Secondary | ICD-10-CM | POA: Diagnosis not present

## 2018-03-07 DIAGNOSIS — E785 Hyperlipidemia, unspecified: Secondary | ICD-10-CM | POA: Diagnosis present

## 2018-03-07 DIAGNOSIS — G4733 Obstructive sleep apnea (adult) (pediatric): Secondary | ICD-10-CM | POA: Diagnosis present

## 2018-03-07 DIAGNOSIS — Z79899 Other long term (current) drug therapy: Secondary | ICD-10-CM | POA: Diagnosis not present

## 2018-03-07 DIAGNOSIS — K5730 Diverticulosis of large intestine without perforation or abscess without bleeding: Secondary | ICD-10-CM | POA: Diagnosis not present

## 2018-03-07 DIAGNOSIS — Z7984 Long term (current) use of oral hypoglycemic drugs: Secondary | ICD-10-CM | POA: Diagnosis not present

## 2018-03-07 DIAGNOSIS — N10 Acute tubulo-interstitial nephritis: Secondary | ICD-10-CM | POA: Diagnosis not present

## 2018-03-07 DIAGNOSIS — E119 Type 2 diabetes mellitus without complications: Secondary | ICD-10-CM | POA: Diagnosis present

## 2018-03-07 DIAGNOSIS — R509 Fever, unspecified: Secondary | ICD-10-CM | POA: Diagnosis not present

## 2018-03-07 DIAGNOSIS — N390 Urinary tract infection, site not specified: Secondary | ICD-10-CM | POA: Diagnosis not present

## 2018-03-07 DIAGNOSIS — Z2821 Immunization not carried out because of patient refusal: Secondary | ICD-10-CM | POA: Diagnosis not present

## 2018-03-15 DIAGNOSIS — Z23 Encounter for immunization: Secondary | ICD-10-CM | POA: Diagnosis not present

## 2018-03-17 DIAGNOSIS — R351 Nocturia: Secondary | ICD-10-CM | POA: Diagnosis not present

## 2018-03-17 DIAGNOSIS — N3946 Mixed incontinence: Secondary | ICD-10-CM | POA: Diagnosis not present

## 2018-03-22 DIAGNOSIS — I700 Atherosclerosis of aorta: Secondary | ICD-10-CM | POA: Diagnosis not present

## 2018-03-22 DIAGNOSIS — K769 Liver disease, unspecified: Secondary | ICD-10-CM | POA: Diagnosis not present

## 2018-03-22 DIAGNOSIS — N281 Cyst of kidney, acquired: Secondary | ICD-10-CM | POA: Diagnosis not present

## 2018-03-22 DIAGNOSIS — K760 Fatty (change of) liver, not elsewhere classified: Secondary | ICD-10-CM | POA: Diagnosis not present

## 2018-03-22 DIAGNOSIS — K449 Diaphragmatic hernia without obstruction or gangrene: Secondary | ICD-10-CM | POA: Diagnosis not present

## 2018-04-08 DIAGNOSIS — I10 Essential (primary) hypertension: Secondary | ICD-10-CM | POA: Diagnosis not present

## 2018-04-08 DIAGNOSIS — N12 Tubulo-interstitial nephritis, not specified as acute or chronic: Secondary | ICD-10-CM | POA: Diagnosis not present

## 2018-04-12 DIAGNOSIS — R300 Dysuria: Secondary | ICD-10-CM | POA: Diagnosis not present

## 2018-04-12 DIAGNOSIS — N952 Postmenopausal atrophic vaginitis: Secondary | ICD-10-CM | POA: Diagnosis not present

## 2018-04-12 DIAGNOSIS — N3946 Mixed incontinence: Secondary | ICD-10-CM | POA: Diagnosis not present

## 2018-04-20 DIAGNOSIS — E890 Postprocedural hypothyroidism: Secondary | ICD-10-CM | POA: Diagnosis not present

## 2018-05-19 DIAGNOSIS — H35032 Hypertensive retinopathy, left eye: Secondary | ICD-10-CM | POA: Diagnosis not present

## 2018-05-19 DIAGNOSIS — H40001 Preglaucoma, unspecified, right eye: Secondary | ICD-10-CM | POA: Diagnosis not present

## 2018-05-19 DIAGNOSIS — E113293 Type 2 diabetes mellitus with mild nonproliferative diabetic retinopathy without macular edema, bilateral: Secondary | ICD-10-CM | POA: Diagnosis not present

## 2018-05-19 DIAGNOSIS — H25812 Combined forms of age-related cataract, left eye: Secondary | ICD-10-CM | POA: Diagnosis not present

## 2018-05-19 DIAGNOSIS — H35373 Puckering of macula, bilateral: Secondary | ICD-10-CM | POA: Diagnosis not present

## 2018-06-14 DIAGNOSIS — R32 Unspecified urinary incontinence: Secondary | ICD-10-CM | POA: Diagnosis not present

## 2018-08-09 DIAGNOSIS — I10 Essential (primary) hypertension: Secondary | ICD-10-CM | POA: Diagnosis not present

## 2018-08-09 DIAGNOSIS — E1165 Type 2 diabetes mellitus with hyperglycemia: Secondary | ICD-10-CM | POA: Diagnosis not present

## 2018-08-09 DIAGNOSIS — D519 Vitamin B12 deficiency anemia, unspecified: Secondary | ICD-10-CM | POA: Diagnosis not present

## 2018-08-09 DIAGNOSIS — K219 Gastro-esophageal reflux disease without esophagitis: Secondary | ICD-10-CM | POA: Diagnosis not present

## 2018-08-09 DIAGNOSIS — Z0001 Encounter for general adult medical examination with abnormal findings: Secondary | ICD-10-CM | POA: Diagnosis not present

## 2018-08-09 DIAGNOSIS — Z6835 Body mass index (BMI) 35.0-35.9, adult: Secondary | ICD-10-CM | POA: Diagnosis not present

## 2018-08-11 DIAGNOSIS — Z6835 Body mass index (BMI) 35.0-35.9, adult: Secondary | ICD-10-CM | POA: Diagnosis not present

## 2018-08-11 DIAGNOSIS — E1165 Type 2 diabetes mellitus with hyperglycemia: Secondary | ICD-10-CM | POA: Diagnosis not present

## 2018-08-11 DIAGNOSIS — F3289 Other specified depressive episodes: Secondary | ICD-10-CM | POA: Diagnosis not present

## 2018-08-11 DIAGNOSIS — M129 Arthropathy, unspecified: Secondary | ICD-10-CM | POA: Diagnosis not present

## 2018-08-11 DIAGNOSIS — I10 Essential (primary) hypertension: Secondary | ICD-10-CM | POA: Diagnosis not present

## 2018-08-11 DIAGNOSIS — B0089 Other herpesviral infection: Secondary | ICD-10-CM | POA: Diagnosis not present

## 2018-08-11 DIAGNOSIS — E7800 Pure hypercholesterolemia, unspecified: Secondary | ICD-10-CM | POA: Diagnosis not present

## 2018-08-11 DIAGNOSIS — C8590 Non-Hodgkin lymphoma, unspecified, unspecified site: Secondary | ICD-10-CM | POA: Diagnosis not present

## 2018-08-19 DIAGNOSIS — H2512 Age-related nuclear cataract, left eye: Secondary | ICD-10-CM | POA: Diagnosis not present

## 2018-08-19 DIAGNOSIS — Z961 Presence of intraocular lens: Secondary | ICD-10-CM | POA: Diagnosis not present

## 2018-08-19 DIAGNOSIS — H401123 Primary open-angle glaucoma, left eye, severe stage: Secondary | ICD-10-CM | POA: Diagnosis not present

## 2018-08-19 DIAGNOSIS — H348122 Central retinal vein occlusion, left eye, stable: Secondary | ICD-10-CM | POA: Diagnosis not present

## 2018-10-01 DIAGNOSIS — R32 Unspecified urinary incontinence: Secondary | ICD-10-CM | POA: Diagnosis not present

## 2018-10-01 DIAGNOSIS — R351 Nocturia: Secondary | ICD-10-CM | POA: Diagnosis not present

## 2018-10-01 DIAGNOSIS — N952 Postmenopausal atrophic vaginitis: Secondary | ICD-10-CM | POA: Diagnosis not present

## 2018-11-25 DIAGNOSIS — G4730 Sleep apnea, unspecified: Secondary | ICD-10-CM | POA: Diagnosis not present

## 2018-11-25 DIAGNOSIS — I10 Essential (primary) hypertension: Secondary | ICD-10-CM | POA: Diagnosis not present

## 2018-11-25 DIAGNOSIS — Z08 Encounter for follow-up examination after completed treatment for malignant neoplasm: Secondary | ICD-10-CM | POA: Diagnosis not present

## 2018-11-25 DIAGNOSIS — M1990 Unspecified osteoarthritis, unspecified site: Secondary | ICD-10-CM | POA: Diagnosis not present

## 2018-11-25 DIAGNOSIS — E1169 Type 2 diabetes mellitus with other specified complication: Secondary | ICD-10-CM | POA: Diagnosis not present

## 2018-11-25 DIAGNOSIS — Z8572 Personal history of non-Hodgkin lymphomas: Secondary | ICD-10-CM | POA: Diagnosis not present

## 2018-11-25 DIAGNOSIS — E785 Hyperlipidemia, unspecified: Secondary | ICD-10-CM | POA: Diagnosis not present

## 2019-01-07 DIAGNOSIS — R351 Nocturia: Secondary | ICD-10-CM | POA: Diagnosis not present

## 2019-01-07 DIAGNOSIS — N952 Postmenopausal atrophic vaginitis: Secondary | ICD-10-CM | POA: Diagnosis not present

## 2019-01-07 DIAGNOSIS — R32 Unspecified urinary incontinence: Secondary | ICD-10-CM | POA: Diagnosis not present

## 2019-02-03 DIAGNOSIS — H348122 Central retinal vein occlusion, left eye, stable: Secondary | ICD-10-CM | POA: Diagnosis not present

## 2019-02-03 DIAGNOSIS — H401123 Primary open-angle glaucoma, left eye, severe stage: Secondary | ICD-10-CM | POA: Diagnosis not present

## 2019-02-03 DIAGNOSIS — Z961 Presence of intraocular lens: Secondary | ICD-10-CM | POA: Diagnosis not present

## 2019-02-03 DIAGNOSIS — H2512 Age-related nuclear cataract, left eye: Secondary | ICD-10-CM | POA: Diagnosis not present

## 2019-02-07 DIAGNOSIS — H5712 Ocular pain, left eye: Secondary | ICD-10-CM | POA: Diagnosis not present

## 2019-02-07 DIAGNOSIS — H5442A5 Blindness left eye category 5, normal vision right eye: Secondary | ICD-10-CM | POA: Diagnosis not present

## 2019-02-20 DIAGNOSIS — H5710 Ocular pain, unspecified eye: Secondary | ICD-10-CM | POA: Diagnosis not present

## 2019-02-20 DIAGNOSIS — Z20828 Contact with and (suspected) exposure to other viral communicable diseases: Secondary | ICD-10-CM | POA: Diagnosis not present

## 2019-02-20 DIAGNOSIS — H5440 Blindness, one eye, unspecified eye: Secondary | ICD-10-CM | POA: Diagnosis not present

## 2019-02-23 DIAGNOSIS — Z79899 Other long term (current) drug therapy: Secondary | ICD-10-CM | POA: Diagnosis not present

## 2019-02-23 DIAGNOSIS — N189 Chronic kidney disease, unspecified: Secondary | ICD-10-CM | POA: Diagnosis not present

## 2019-02-23 DIAGNOSIS — H5462 Unqualified visual loss, left eye, normal vision right eye: Secondary | ICD-10-CM | POA: Diagnosis not present

## 2019-02-23 DIAGNOSIS — I129 Hypertensive chronic kidney disease with stage 1 through stage 4 chronic kidney disease, or unspecified chronic kidney disease: Secondary | ICD-10-CM | POA: Diagnosis not present

## 2019-02-23 DIAGNOSIS — Z7984 Long term (current) use of oral hypoglycemic drugs: Secondary | ICD-10-CM | POA: Diagnosis not present

## 2019-02-23 DIAGNOSIS — H3589 Other specified retinal disorders: Secondary | ICD-10-CM | POA: Diagnosis not present

## 2019-02-23 DIAGNOSIS — H5442A5 Blindness left eye category 5, normal vision right eye: Secondary | ICD-10-CM | POA: Diagnosis not present

## 2019-02-23 DIAGNOSIS — H5710 Ocular pain, unspecified eye: Secondary | ICD-10-CM | POA: Diagnosis not present

## 2019-02-23 DIAGNOSIS — E669 Obesity, unspecified: Secondary | ICD-10-CM | POA: Diagnosis not present

## 2019-02-23 DIAGNOSIS — G4730 Sleep apnea, unspecified: Secondary | ICD-10-CM | POA: Diagnosis not present

## 2019-02-23 DIAGNOSIS — H211X1 Other vascular disorders of iris and ciliary body, right eye: Secondary | ICD-10-CM | POA: Diagnosis not present

## 2019-02-23 DIAGNOSIS — Z6836 Body mass index (BMI) 36.0-36.9, adult: Secondary | ICD-10-CM | POA: Diagnosis not present

## 2019-02-23 DIAGNOSIS — E039 Hypothyroidism, unspecified: Secondary | ICD-10-CM | POA: Diagnosis not present

## 2019-02-23 DIAGNOSIS — E1122 Type 2 diabetes mellitus with diabetic chronic kidney disease: Secondary | ICD-10-CM | POA: Diagnosis not present

## 2019-02-23 DIAGNOSIS — H5440 Blindness, one eye, unspecified eye: Secondary | ICD-10-CM | POA: Diagnosis not present

## 2019-04-08 DIAGNOSIS — K219 Gastro-esophageal reflux disease without esophagitis: Secondary | ICD-10-CM | POA: Diagnosis not present

## 2019-04-08 DIAGNOSIS — I10 Essential (primary) hypertension: Secondary | ICD-10-CM | POA: Diagnosis not present

## 2019-04-28 DIAGNOSIS — Z87440 Personal history of urinary (tract) infections: Secondary | ICD-10-CM | POA: Diagnosis not present

## 2019-04-28 DIAGNOSIS — N3946 Mixed incontinence: Secondary | ICD-10-CM | POA: Diagnosis not present

## 2019-04-28 DIAGNOSIS — N952 Postmenopausal atrophic vaginitis: Secondary | ICD-10-CM | POA: Diagnosis not present

## 2019-04-29 DIAGNOSIS — Z79899 Other long term (current) drug therapy: Secondary | ICD-10-CM | POA: Diagnosis not present

## 2019-04-29 DIAGNOSIS — M542 Cervicalgia: Secondary | ICD-10-CM | POA: Diagnosis not present

## 2019-04-29 DIAGNOSIS — I10 Essential (primary) hypertension: Secondary | ICD-10-CM | POA: Diagnosis not present

## 2019-04-29 DIAGNOSIS — Z8719 Personal history of other diseases of the digestive system: Secondary | ICD-10-CM | POA: Diagnosis not present

## 2019-04-29 DIAGNOSIS — F418 Other specified anxiety disorders: Secondary | ICD-10-CM | POA: Diagnosis not present

## 2019-04-29 DIAGNOSIS — K219 Gastro-esophageal reflux disease without esophagitis: Secondary | ICD-10-CM | POA: Diagnosis not present

## 2019-04-29 DIAGNOSIS — Z96652 Presence of left artificial knee joint: Secondary | ICD-10-CM | POA: Diagnosis not present

## 2019-04-29 DIAGNOSIS — E042 Nontoxic multinodular goiter: Secondary | ICD-10-CM | POA: Diagnosis not present

## 2019-04-29 DIAGNOSIS — E039 Hypothyroidism, unspecified: Secondary | ICD-10-CM | POA: Diagnosis not present

## 2019-04-29 DIAGNOSIS — Z8739 Personal history of other diseases of the musculoskeletal system and connective tissue: Secondary | ICD-10-CM | POA: Diagnosis not present

## 2019-04-29 DIAGNOSIS — Z7984 Long term (current) use of oral hypoglycemic drugs: Secondary | ICD-10-CM | POA: Diagnosis not present

## 2019-04-29 DIAGNOSIS — E1169 Type 2 diabetes mellitus with other specified complication: Secondary | ICD-10-CM | POA: Diagnosis not present

## 2019-04-29 DIAGNOSIS — S8001XA Contusion of right knee, initial encounter: Secondary | ICD-10-CM | POA: Diagnosis not present

## 2019-04-29 DIAGNOSIS — M7989 Other specified soft tissue disorders: Secondary | ICD-10-CM | POA: Diagnosis not present

## 2019-04-29 DIAGNOSIS — S299XXA Unspecified injury of thorax, initial encounter: Secondary | ICD-10-CM | POA: Diagnosis not present

## 2019-04-29 DIAGNOSIS — S3991XA Unspecified injury of abdomen, initial encounter: Secondary | ICD-10-CM | POA: Diagnosis not present

## 2019-04-29 DIAGNOSIS — E785 Hyperlipidemia, unspecified: Secondary | ICD-10-CM | POA: Diagnosis not present

## 2019-04-29 DIAGNOSIS — E1159 Type 2 diabetes mellitus with other circulatory complications: Secondary | ICD-10-CM | POA: Diagnosis not present

## 2019-04-29 DIAGNOSIS — S3993XA Unspecified injury of pelvis, initial encounter: Secondary | ICD-10-CM | POA: Diagnosis not present

## 2019-04-29 DIAGNOSIS — E119 Type 2 diabetes mellitus without complications: Secondary | ICD-10-CM | POA: Diagnosis not present

## 2019-04-29 DIAGNOSIS — F419 Anxiety disorder, unspecified: Secondary | ICD-10-CM | POA: Diagnosis not present

## 2019-04-29 DIAGNOSIS — E890 Postprocedural hypothyroidism: Secondary | ICD-10-CM | POA: Diagnosis not present

## 2019-04-29 DIAGNOSIS — Z8572 Personal history of non-Hodgkin lymphomas: Secondary | ICD-10-CM | POA: Diagnosis not present

## 2019-04-29 DIAGNOSIS — S301XXA Contusion of abdominal wall, initial encounter: Secondary | ICD-10-CM | POA: Diagnosis not present

## 2019-04-29 DIAGNOSIS — S20212A Contusion of left front wall of thorax, initial encounter: Secondary | ICD-10-CM | POA: Diagnosis not present

## 2019-04-29 DIAGNOSIS — S0990XA Unspecified injury of head, initial encounter: Secondary | ICD-10-CM | POA: Diagnosis not present

## 2019-05-02 DIAGNOSIS — S8001XA Contusion of right knee, initial encounter: Secondary | ICD-10-CM | POA: Diagnosis not present

## 2019-05-02 DIAGNOSIS — M25561 Pain in right knee: Secondary | ICD-10-CM | POA: Diagnosis not present

## 2019-05-02 DIAGNOSIS — N6489 Other specified disorders of breast: Secondary | ICD-10-CM | POA: Diagnosis not present

## 2019-05-02 DIAGNOSIS — S301XXA Contusion of abdominal wall, initial encounter: Secondary | ICD-10-CM | POA: Diagnosis not present

## 2019-05-02 DIAGNOSIS — Z6836 Body mass index (BMI) 36.0-36.9, adult: Secondary | ICD-10-CM | POA: Diagnosis not present

## 2019-05-09 DIAGNOSIS — E782 Mixed hyperlipidemia: Secondary | ICD-10-CM | POA: Diagnosis not present

## 2019-05-09 DIAGNOSIS — I10 Essential (primary) hypertension: Secondary | ICD-10-CM | POA: Diagnosis not present

## 2019-05-09 DIAGNOSIS — E1165 Type 2 diabetes mellitus with hyperglycemia: Secondary | ICD-10-CM | POA: Diagnosis not present

## 2019-06-09 DIAGNOSIS — E782 Mixed hyperlipidemia: Secondary | ICD-10-CM | POA: Diagnosis not present

## 2019-06-09 DIAGNOSIS — I10 Essential (primary) hypertension: Secondary | ICD-10-CM | POA: Diagnosis not present

## 2019-07-08 DIAGNOSIS — I10 Essential (primary) hypertension: Secondary | ICD-10-CM | POA: Diagnosis not present

## 2019-07-08 DIAGNOSIS — E7849 Other hyperlipidemia: Secondary | ICD-10-CM | POA: Diagnosis not present

## 2019-07-12 DIAGNOSIS — Z96652 Presence of left artificial knee joint: Secondary | ICD-10-CM | POA: Diagnosis not present

## 2019-07-12 DIAGNOSIS — M1711 Unilateral primary osteoarthritis, right knee: Secondary | ICD-10-CM | POA: Diagnosis not present

## 2019-07-19 DIAGNOSIS — Q111 Other anophthalmos: Secondary | ICD-10-CM | POA: Diagnosis not present

## 2019-07-21 DIAGNOSIS — H2512 Age-related nuclear cataract, left eye: Secondary | ICD-10-CM | POA: Diagnosis not present

## 2019-07-21 DIAGNOSIS — Z961 Presence of intraocular lens: Secondary | ICD-10-CM | POA: Diagnosis not present

## 2019-07-21 DIAGNOSIS — H401123 Primary open-angle glaucoma, left eye, severe stage: Secondary | ICD-10-CM | POA: Diagnosis not present

## 2019-07-21 DIAGNOSIS — H348122 Central retinal vein occlusion, left eye, stable: Secondary | ICD-10-CM | POA: Diagnosis not present

## 2019-08-12 DIAGNOSIS — E1165 Type 2 diabetes mellitus with hyperglycemia: Secondary | ICD-10-CM | POA: Diagnosis not present

## 2019-08-12 DIAGNOSIS — E7800 Pure hypercholesterolemia, unspecified: Secondary | ICD-10-CM | POA: Diagnosis not present

## 2019-08-12 DIAGNOSIS — Z0001 Encounter for general adult medical examination with abnormal findings: Secondary | ICD-10-CM | POA: Diagnosis not present

## 2019-08-12 DIAGNOSIS — I10 Essential (primary) hypertension: Secondary | ICD-10-CM | POA: Diagnosis not present

## 2019-08-16 DIAGNOSIS — I10 Essential (primary) hypertension: Secondary | ICD-10-CM | POA: Diagnosis not present

## 2019-08-16 DIAGNOSIS — Z0001 Encounter for general adult medical examination with abnormal findings: Secondary | ICD-10-CM | POA: Diagnosis not present

## 2019-08-16 DIAGNOSIS — Z6834 Body mass index (BMI) 34.0-34.9, adult: Secondary | ICD-10-CM | POA: Diagnosis not present

## 2019-08-16 DIAGNOSIS — C8590 Non-Hodgkin lymphoma, unspecified, unspecified site: Secondary | ICD-10-CM | POA: Diagnosis not present

## 2019-08-16 DIAGNOSIS — K219 Gastro-esophageal reflux disease without esophagitis: Secondary | ICD-10-CM | POA: Diagnosis not present

## 2019-08-16 DIAGNOSIS — E7800 Pure hypercholesterolemia, unspecified: Secondary | ICD-10-CM | POA: Diagnosis not present

## 2019-08-16 DIAGNOSIS — E1165 Type 2 diabetes mellitus with hyperglycemia: Secondary | ICD-10-CM | POA: Diagnosis not present

## 2019-08-16 DIAGNOSIS — F3289 Other specified depressive episodes: Secondary | ICD-10-CM | POA: Diagnosis not present

## 2019-08-23 DIAGNOSIS — S8001XD Contusion of right knee, subsequent encounter: Secondary | ICD-10-CM | POA: Diagnosis not present

## 2019-08-23 DIAGNOSIS — M1711 Unilateral primary osteoarthritis, right knee: Secondary | ICD-10-CM | POA: Diagnosis not present

## 2019-10-24 DIAGNOSIS — N952 Postmenopausal atrophic vaginitis: Secondary | ICD-10-CM | POA: Diagnosis not present

## 2019-10-24 DIAGNOSIS — N3946 Mixed incontinence: Secondary | ICD-10-CM | POA: Diagnosis not present

## 2019-11-23 DIAGNOSIS — C8200 Follicular lymphoma grade I, unspecified site: Secondary | ICD-10-CM | POA: Diagnosis not present

## 2020-01-04 DIAGNOSIS — H4089 Other specified glaucoma: Secondary | ICD-10-CM | POA: Diagnosis not present

## 2020-01-04 DIAGNOSIS — Z961 Presence of intraocular lens: Secondary | ICD-10-CM | POA: Diagnosis not present

## 2020-01-04 DIAGNOSIS — H348121 Central retinal vein occlusion, left eye, with retinal neovascularization: Secondary | ICD-10-CM | POA: Diagnosis not present

## 2020-01-04 DIAGNOSIS — Q111 Other anophthalmos: Secondary | ICD-10-CM | POA: Diagnosis not present

## 2020-01-25 DIAGNOSIS — N3946 Mixed incontinence: Secondary | ICD-10-CM | POA: Diagnosis not present

## 2020-01-25 DIAGNOSIS — N3000 Acute cystitis without hematuria: Secondary | ICD-10-CM | POA: Diagnosis not present

## 2020-04-25 DIAGNOSIS — R8279 Other abnormal findings on microbiological examination of urine: Secondary | ICD-10-CM | POA: Diagnosis not present

## 2020-04-25 DIAGNOSIS — N3946 Mixed incontinence: Secondary | ICD-10-CM | POA: Diagnosis not present

## 2020-04-25 DIAGNOSIS — R32 Unspecified urinary incontinence: Secondary | ICD-10-CM | POA: Diagnosis not present

## 2020-04-27 DIAGNOSIS — E890 Postprocedural hypothyroidism: Secondary | ICD-10-CM | POA: Diagnosis not present

## 2020-07-23 DIAGNOSIS — Q111 Other anophthalmos: Secondary | ICD-10-CM | POA: Diagnosis not present

## 2020-07-27 DIAGNOSIS — R8271 Bacteriuria: Secondary | ICD-10-CM | POA: Diagnosis not present

## 2020-07-27 DIAGNOSIS — R32 Unspecified urinary incontinence: Secondary | ICD-10-CM | POA: Diagnosis not present

## 2020-09-03 DIAGNOSIS — H43811 Vitreous degeneration, right eye: Secondary | ICD-10-CM | POA: Diagnosis not present

## 2020-09-03 DIAGNOSIS — Z961 Presence of intraocular lens: Secondary | ICD-10-CM | POA: Diagnosis not present

## 2020-11-11 DIAGNOSIS — Z20822 Contact with and (suspected) exposure to covid-19: Secondary | ICD-10-CM | POA: Diagnosis not present

## 2020-11-11 DIAGNOSIS — R5383 Other fatigue: Secondary | ICD-10-CM | POA: Diagnosis not present

## 2020-11-22 DIAGNOSIS — C8200 Follicular lymphoma grade I, unspecified site: Secondary | ICD-10-CM | POA: Diagnosis not present

## 2020-12-26 ENCOUNTER — Other Ambulatory Visit: Payer: Self-pay | Admitting: Urology

## 2021-01-16 DIAGNOSIS — R32 Unspecified urinary incontinence: Secondary | ICD-10-CM | POA: Diagnosis not present

## 2021-01-16 DIAGNOSIS — N3000 Acute cystitis without hematuria: Secondary | ICD-10-CM | POA: Diagnosis not present

## 2021-02-27 DIAGNOSIS — R32 Unspecified urinary incontinence: Secondary | ICD-10-CM | POA: Diagnosis not present

## 2021-02-27 DIAGNOSIS — N3020 Other chronic cystitis without hematuria: Secondary | ICD-10-CM | POA: Diagnosis not present

## 2021-02-27 DIAGNOSIS — N139 Obstructive and reflux uropathy, unspecified: Secondary | ICD-10-CM | POA: Diagnosis not present

## 2021-05-03 DIAGNOSIS — K219 Gastro-esophageal reflux disease without esophagitis: Secondary | ICD-10-CM | POA: Diagnosis not present

## 2021-05-03 DIAGNOSIS — Z1329 Encounter for screening for other suspected endocrine disorder: Secondary | ICD-10-CM | POA: Diagnosis not present

## 2021-05-03 DIAGNOSIS — E7800 Pure hypercholesterolemia, unspecified: Secondary | ICD-10-CM | POA: Diagnosis not present

## 2021-05-03 DIAGNOSIS — E7801 Familial hypercholesterolemia: Secondary | ICD-10-CM | POA: Diagnosis not present

## 2021-05-03 DIAGNOSIS — Z0001 Encounter for general adult medical examination with abnormal findings: Secondary | ICD-10-CM | POA: Diagnosis not present

## 2021-05-03 DIAGNOSIS — I10 Essential (primary) hypertension: Secondary | ICD-10-CM | POA: Diagnosis not present

## 2021-05-03 DIAGNOSIS — E1165 Type 2 diabetes mellitus with hyperglycemia: Secondary | ICD-10-CM | POA: Diagnosis not present

## 2021-05-03 DIAGNOSIS — D519 Vitamin B12 deficiency anemia, unspecified: Secondary | ICD-10-CM | POA: Diagnosis not present

## 2021-05-07 DIAGNOSIS — R059 Cough, unspecified: Secondary | ICD-10-CM | POA: Diagnosis not present

## 2021-05-07 DIAGNOSIS — Z20828 Contact with and (suspected) exposure to other viral communicable diseases: Secondary | ICD-10-CM | POA: Diagnosis not present

## 2021-05-27 DIAGNOSIS — Z8572 Personal history of non-Hodgkin lymphomas: Secondary | ICD-10-CM | POA: Diagnosis not present

## 2021-05-27 DIAGNOSIS — N390 Urinary tract infection, site not specified: Secondary | ICD-10-CM | POA: Diagnosis not present

## 2021-05-27 DIAGNOSIS — E785 Hyperlipidemia, unspecified: Secondary | ICD-10-CM | POA: Diagnosis not present

## 2021-05-27 DIAGNOSIS — Z6832 Body mass index (BMI) 32.0-32.9, adult: Secondary | ICD-10-CM | POA: Diagnosis not present

## 2021-05-27 DIAGNOSIS — E119 Type 2 diabetes mellitus without complications: Secondary | ICD-10-CM | POA: Diagnosis not present

## 2021-05-27 DIAGNOSIS — I10 Essential (primary) hypertension: Secondary | ICD-10-CM | POA: Diagnosis not present

## 2021-05-27 DIAGNOSIS — Z20828 Contact with and (suspected) exposure to other viral communicable diseases: Secondary | ICD-10-CM | POA: Diagnosis not present

## 2021-05-27 DIAGNOSIS — G43909 Migraine, unspecified, not intractable, without status migrainosus: Secondary | ICD-10-CM | POA: Diagnosis not present

## 2021-06-26 DIAGNOSIS — N3020 Other chronic cystitis without hematuria: Secondary | ICD-10-CM | POA: Diagnosis not present

## 2021-06-26 DIAGNOSIS — N952 Postmenopausal atrophic vaginitis: Secondary | ICD-10-CM | POA: Diagnosis not present

## 2021-06-26 DIAGNOSIS — N3946 Mixed incontinence: Secondary | ICD-10-CM | POA: Diagnosis not present

## 2021-08-12 DIAGNOSIS — Z1231 Encounter for screening mammogram for malignant neoplasm of breast: Secondary | ICD-10-CM | POA: Diagnosis not present

## 2021-09-12 DIAGNOSIS — H353111 Nonexudative age-related macular degeneration, right eye, early dry stage: Secondary | ICD-10-CM | POA: Diagnosis not present

## 2021-09-12 DIAGNOSIS — H5211 Myopia, right eye: Secondary | ICD-10-CM | POA: Diagnosis not present

## 2021-09-12 DIAGNOSIS — Q111 Other anophthalmos: Secondary | ICD-10-CM | POA: Diagnosis not present

## 2021-09-12 DIAGNOSIS — E119 Type 2 diabetes mellitus without complications: Secondary | ICD-10-CM | POA: Diagnosis not present

## 2021-09-12 DIAGNOSIS — H524 Presbyopia: Secondary | ICD-10-CM | POA: Diagnosis not present

## 2021-09-12 DIAGNOSIS — H348121 Central retinal vein occlusion, left eye, with retinal neovascularization: Secondary | ICD-10-CM | POA: Diagnosis not present

## 2021-09-12 DIAGNOSIS — H52221 Regular astigmatism, right eye: Secondary | ICD-10-CM | POA: Diagnosis not present

## 2021-09-25 DIAGNOSIS — R351 Nocturia: Secondary | ICD-10-CM | POA: Diagnosis not present

## 2021-09-25 DIAGNOSIS — R32 Unspecified urinary incontinence: Secondary | ICD-10-CM | POA: Diagnosis not present

## 2021-09-25 DIAGNOSIS — R8271 Bacteriuria: Secondary | ICD-10-CM | POA: Diagnosis not present

## 2021-10-08 DIAGNOSIS — C8200 Follicular lymphoma grade I, unspecified site: Secondary | ICD-10-CM | POA: Diagnosis not present

## 2022-01-13 DIAGNOSIS — N390 Urinary tract infection, site not specified: Secondary | ICD-10-CM | POA: Diagnosis not present

## 2022-01-13 DIAGNOSIS — Z20828 Contact with and (suspected) exposure to other viral communicable diseases: Secondary | ICD-10-CM | POA: Diagnosis not present

## 2022-01-13 DIAGNOSIS — R030 Elevated blood-pressure reading, without diagnosis of hypertension: Secondary | ICD-10-CM | POA: Diagnosis not present

## 2022-01-13 DIAGNOSIS — B349 Viral infection, unspecified: Secondary | ICD-10-CM | POA: Diagnosis not present

## 2022-01-13 DIAGNOSIS — Z6833 Body mass index (BMI) 33.0-33.9, adult: Secondary | ICD-10-CM | POA: Diagnosis not present

## 2022-01-29 DIAGNOSIS — B349 Viral infection, unspecified: Secondary | ICD-10-CM | POA: Diagnosis not present

## 2022-01-29 DIAGNOSIS — B002 Herpesviral gingivostomatitis and pharyngotonsillitis: Secondary | ICD-10-CM | POA: Diagnosis not present

## 2022-01-29 DIAGNOSIS — N390 Urinary tract infection, site not specified: Secondary | ICD-10-CM | POA: Diagnosis not present

## 2022-01-29 DIAGNOSIS — Z6833 Body mass index (BMI) 33.0-33.9, adult: Secondary | ICD-10-CM | POA: Diagnosis not present

## 2022-01-29 DIAGNOSIS — R030 Elevated blood-pressure reading, without diagnosis of hypertension: Secondary | ICD-10-CM | POA: Diagnosis not present

## 2022-04-02 DIAGNOSIS — N3020 Other chronic cystitis without hematuria: Secondary | ICD-10-CM | POA: Diagnosis not present

## 2022-04-02 DIAGNOSIS — N3946 Mixed incontinence: Secondary | ICD-10-CM | POA: Diagnosis not present

## 2022-04-11 DIAGNOSIS — C8200 Follicular lymphoma grade I, unspecified site: Secondary | ICD-10-CM | POA: Diagnosis not present

## 2022-04-28 DIAGNOSIS — E890 Postprocedural hypothyroidism: Secondary | ICD-10-CM | POA: Diagnosis not present

## 2022-05-07 DIAGNOSIS — R8279 Other abnormal findings on microbiological examination of urine: Secondary | ICD-10-CM | POA: Diagnosis not present

## 2022-05-07 DIAGNOSIS — N3020 Other chronic cystitis without hematuria: Secondary | ICD-10-CM | POA: Diagnosis not present

## 2022-05-07 DIAGNOSIS — N3946 Mixed incontinence: Secondary | ICD-10-CM | POA: Diagnosis not present

## 2022-05-26 DIAGNOSIS — E559 Vitamin D deficiency, unspecified: Secondary | ICD-10-CM | POA: Diagnosis not present

## 2022-05-26 DIAGNOSIS — E1165 Type 2 diabetes mellitus with hyperglycemia: Secondary | ICD-10-CM | POA: Diagnosis not present

## 2022-05-26 DIAGNOSIS — E7801 Familial hypercholesterolemia: Secondary | ICD-10-CM | POA: Diagnosis not present

## 2022-05-26 DIAGNOSIS — E7800 Pure hypercholesterolemia, unspecified: Secondary | ICD-10-CM | POA: Diagnosis not present

## 2022-05-26 DIAGNOSIS — I10 Essential (primary) hypertension: Secondary | ICD-10-CM | POA: Diagnosis not present

## 2022-05-26 DIAGNOSIS — Z1329 Encounter for screening for other suspected endocrine disorder: Secondary | ICD-10-CM | POA: Diagnosis not present

## 2022-05-26 DIAGNOSIS — E785 Hyperlipidemia, unspecified: Secondary | ICD-10-CM | POA: Diagnosis not present

## 2022-06-04 DIAGNOSIS — D519 Vitamin B12 deficiency anemia, unspecified: Secondary | ICD-10-CM | POA: Diagnosis not present

## 2022-06-04 DIAGNOSIS — Z0001 Encounter for general adult medical examination with abnormal findings: Secondary | ICD-10-CM | POA: Diagnosis not present

## 2022-06-04 DIAGNOSIS — G4730 Sleep apnea, unspecified: Secondary | ICD-10-CM | POA: Diagnosis not present

## 2022-06-04 DIAGNOSIS — E1165 Type 2 diabetes mellitus with hyperglycemia: Secondary | ICD-10-CM | POA: Diagnosis not present

## 2022-06-04 DIAGNOSIS — E785 Hyperlipidemia, unspecified: Secondary | ICD-10-CM | POA: Diagnosis not present

## 2022-06-04 DIAGNOSIS — I10 Essential (primary) hypertension: Secondary | ICD-10-CM | POA: Diagnosis not present

## 2022-06-05 DIAGNOSIS — I10 Essential (primary) hypertension: Secondary | ICD-10-CM | POA: Diagnosis not present

## 2022-06-05 DIAGNOSIS — G4700 Insomnia, unspecified: Secondary | ICD-10-CM | POA: Diagnosis not present

## 2022-06-05 DIAGNOSIS — E785 Hyperlipidemia, unspecified: Secondary | ICD-10-CM | POA: Diagnosis not present

## 2022-06-05 DIAGNOSIS — Z1239 Encounter for other screening for malignant neoplasm of breast: Secondary | ICD-10-CM | POA: Diagnosis not present

## 2022-06-05 DIAGNOSIS — G43909 Migraine, unspecified, not intractable, without status migrainosus: Secondary | ICD-10-CM | POA: Diagnosis not present

## 2022-06-05 DIAGNOSIS — Z8572 Personal history of non-Hodgkin lymphomas: Secondary | ICD-10-CM | POA: Diagnosis not present

## 2022-06-05 DIAGNOSIS — E119 Type 2 diabetes mellitus without complications: Secondary | ICD-10-CM | POA: Diagnosis not present

## 2022-06-05 DIAGNOSIS — Z8616 Personal history of COVID-19: Secondary | ICD-10-CM | POA: Diagnosis not present

## 2022-06-05 DIAGNOSIS — H5440 Blindness, one eye, unspecified eye: Secondary | ICD-10-CM | POA: Diagnosis not present

## 2022-06-19 DIAGNOSIS — Z23 Encounter for immunization: Secondary | ICD-10-CM | POA: Diagnosis not present

## 2022-06-30 DIAGNOSIS — N390 Urinary tract infection, site not specified: Secondary | ICD-10-CM | POA: Diagnosis not present

## 2022-06-30 DIAGNOSIS — R109 Unspecified abdominal pain: Secondary | ICD-10-CM | POA: Diagnosis not present

## 2022-06-30 DIAGNOSIS — I10 Essential (primary) hypertension: Secondary | ICD-10-CM | POA: Diagnosis not present

## 2022-06-30 DIAGNOSIS — Z6833 Body mass index (BMI) 33.0-33.9, adult: Secondary | ICD-10-CM | POA: Diagnosis not present

## 2022-07-01 DIAGNOSIS — K449 Diaphragmatic hernia without obstruction or gangrene: Secondary | ICD-10-CM | POA: Diagnosis not present

## 2022-07-01 DIAGNOSIS — R109 Unspecified abdominal pain: Secondary | ICD-10-CM | POA: Diagnosis not present

## 2022-07-01 DIAGNOSIS — K5752 Diverticulitis of both small and large intestine without perforation or abscess without bleeding: Secondary | ICD-10-CM | POA: Diagnosis not present

## 2022-07-02 DIAGNOSIS — R030 Elevated blood-pressure reading, without diagnosis of hypertension: Secondary | ICD-10-CM | POA: Diagnosis not present

## 2022-07-02 DIAGNOSIS — Z6833 Body mass index (BMI) 33.0-33.9, adult: Secondary | ICD-10-CM | POA: Diagnosis not present

## 2022-07-02 DIAGNOSIS — K5792 Diverticulitis of intestine, part unspecified, without perforation or abscess without bleeding: Secondary | ICD-10-CM | POA: Diagnosis not present

## 2022-07-14 DIAGNOSIS — Z4422 Encounter for fitting and adjustment of artificial left eye: Secondary | ICD-10-CM | POA: Diagnosis not present

## 2022-07-16 ENCOUNTER — Other Ambulatory Visit (HOSPITAL_BASED_OUTPATIENT_CLINIC_OR_DEPARTMENT_OTHER): Payer: Self-pay

## 2022-07-16 DIAGNOSIS — R0681 Apnea, not elsewhere classified: Secondary | ICD-10-CM

## 2022-07-16 DIAGNOSIS — G4709 Other insomnia: Secondary | ICD-10-CM

## 2022-07-16 DIAGNOSIS — R0683 Snoring: Secondary | ICD-10-CM

## 2022-07-16 DIAGNOSIS — G4710 Hypersomnia, unspecified: Secondary | ICD-10-CM

## 2022-07-16 DIAGNOSIS — R5383 Other fatigue: Secondary | ICD-10-CM

## 2022-08-13 DIAGNOSIS — M8589 Other specified disorders of bone density and structure, multiple sites: Secondary | ICD-10-CM | POA: Diagnosis not present

## 2022-08-13 DIAGNOSIS — M810 Age-related osteoporosis without current pathological fracture: Secondary | ICD-10-CM | POA: Diagnosis not present

## 2022-09-02 DIAGNOSIS — Z1212 Encounter for screening for malignant neoplasm of rectum: Secondary | ICD-10-CM | POA: Diagnosis not present

## 2022-09-02 DIAGNOSIS — Z1211 Encounter for screening for malignant neoplasm of colon: Secondary | ICD-10-CM | POA: Diagnosis not present

## 2022-09-23 ENCOUNTER — Ambulatory Visit: Payer: Medicare HMO | Attending: Internal Medicine | Admitting: Pulmonary Disease

## 2022-09-23 DIAGNOSIS — R0683 Snoring: Secondary | ICD-10-CM

## 2022-09-23 DIAGNOSIS — G4709 Other insomnia: Secondary | ICD-10-CM

## 2022-09-23 DIAGNOSIS — G4710 Hypersomnia, unspecified: Secondary | ICD-10-CM

## 2022-09-23 DIAGNOSIS — R0681 Apnea, not elsewhere classified: Secondary | ICD-10-CM

## 2022-09-23 DIAGNOSIS — R5383 Other fatigue: Secondary | ICD-10-CM

## 2022-09-25 DIAGNOSIS — R0683 Snoring: Secondary | ICD-10-CM | POA: Diagnosis not present

## 2022-09-25 NOTE — Procedures (Signed)
     Patient Name: Hailey [name], Hailey [name], Hailey [name] 09/23/2022 Gender: Female D.O.B: June 28, 1945 Age (years): 76 Referring Provider: Donetta Potts Height (inches): 64 Interpreting Physician: Coralyn Helling MD, ABSM Weight (lbs): 193 RPSGT: Alfonso Ellis BMI: 33 MRN: [id number]  CLINICAL INFORMATION Sleep Study Type: HST  Indication for sleep study: snoring, sleep disruption and daytime sleepiness  SLEEP STUDY TECHNIQUE A multi-channel overnight portable sleep study was performed. The channels recorded were: nasal airflow, thoracic respiratory movement, and oxygen saturation with a pulse oximetry. Snoring was also monitored.  MEDICATIONS Patient self administered medications include: N/A.  SLEEP ARCHITECTURE Patient was studied for 446.4 minutes. The sleep efficiency was 93.0 % and the patient was supine for 0%. The arousal index was 0.0 per hour.  RESPIRATORY PARAMETERS The overall AHI was 45.2 per hour, with a central apnea index of 0 per hour.  The oxygen nadir was 80% during sleep.  CARDIAC DATA Mean heart rate during sleep was 69.4 bpm.  IMPRESSIONS - Severe obstructive sleep apnea occurred during this study (AHI = 45.2/h). - Severe oxygen desaturation was noted during this study (Min O2 = 80%). - Patient snored 17.3% during the sleep.  DIAGNOSIS - Obstructive Sleep Apnea (G47.33)  RECOMMENDATIONS - Additional therapies include CPAP, oral appliance, or surgical assessment. - Avoid alcohol, sedatives and other CNS depressants that may worsen sleep apnea and disrupt normal sleep architecture. - Sleep hygiene should be reviewed to assess factors that may improve sleep quality. - Weight management and regular exercise should be initiated or continued. - Sleep medicine consultation available as needed to assist with management.  [Electronically signed] 09/25/2022 01:19 PM  Coralyn Helling MD, ABSM Diplomate, American Board of Sleep Medicine NPI: [id number]  [facility] PH: [phone]   FX: [phone] ACCREDITED BY THE AMERICAN ACADEMY OF SLEEP MEDICINE

## 2022-10-23 DIAGNOSIS — D3131 Benign neoplasm of right choroid: Secondary | ICD-10-CM | POA: Diagnosis not present

## 2022-10-23 DIAGNOSIS — H524 Presbyopia: Secondary | ICD-10-CM | POA: Diagnosis not present

## 2022-10-23 DIAGNOSIS — E119 Type 2 diabetes mellitus without complications: Secondary | ICD-10-CM | POA: Diagnosis not present

## 2022-10-23 DIAGNOSIS — H348121 Central retinal vein occlusion, left eye, with retinal neovascularization: Secondary | ICD-10-CM | POA: Diagnosis not present

## 2022-10-23 DIAGNOSIS — H353111 Nonexudative age-related macular degeneration, right eye, early dry stage: Secondary | ICD-10-CM | POA: Diagnosis not present

## 2022-10-23 DIAGNOSIS — H52221 Regular astigmatism, right eye: Secondary | ICD-10-CM | POA: Diagnosis not present

## 2022-10-23 DIAGNOSIS — H5211 Myopia, right eye: Secondary | ICD-10-CM | POA: Diagnosis not present

## 2022-10-30 ENCOUNTER — Encounter: Payer: Self-pay | Admitting: *Deleted

## 2022-11-06 ENCOUNTER — Telehealth: Payer: Self-pay | Admitting: *Deleted

## 2022-11-06 NOTE — Telephone Encounter (Signed)
Pt left VM stating she received letter to make OV before scheduling colonoscopy. Her PCP ordered cologuard and came back fine. She does not want to schedule OV or colonoscopy. She was former Dr. Darrick Penna patient. FYI

## 2022-11-10 NOTE — Telephone Encounter (Signed)
Noted  

## 2022-11-19 DIAGNOSIS — E1165 Type 2 diabetes mellitus with hyperglycemia: Secondary | ICD-10-CM | POA: Diagnosis not present

## 2022-11-19 DIAGNOSIS — I10 Essential (primary) hypertension: Secondary | ICD-10-CM | POA: Diagnosis not present

## 2022-11-19 DIAGNOSIS — E119 Type 2 diabetes mellitus without complications: Secondary | ICD-10-CM | POA: Diagnosis not present

## 2022-11-19 DIAGNOSIS — Z1329 Encounter for screening for other suspected endocrine disorder: Secondary | ICD-10-CM | POA: Diagnosis not present

## 2022-12-15 ENCOUNTER — Encounter: Payer: Self-pay | Admitting: Pulmonary Disease

## 2022-12-15 ENCOUNTER — Ambulatory Visit (INDEPENDENT_AMBULATORY_CARE_PROVIDER_SITE_OTHER): Payer: Medicare HMO | Admitting: Pulmonary Disease

## 2022-12-15 VITALS — BP 134/76 | HR 86 | Ht 64.0 in | Wt 184.4 lb

## 2022-12-15 DIAGNOSIS — Z7189 Other specified counseling: Secondary | ICD-10-CM

## 2022-12-15 DIAGNOSIS — G4730 Sleep apnea, unspecified: Secondary | ICD-10-CM

## 2022-12-15 DIAGNOSIS — R351 Nocturia: Secondary | ICD-10-CM

## 2022-12-15 DIAGNOSIS — Z72821 Inadequate sleep hygiene: Secondary | ICD-10-CM | POA: Diagnosis not present

## 2022-12-15 DIAGNOSIS — G4733 Obstructive sleep apnea (adult) (pediatric): Secondary | ICD-10-CM

## 2022-12-15 DIAGNOSIS — E669 Obesity, unspecified: Secondary | ICD-10-CM

## 2022-12-15 NOTE — Progress Notes (Signed)
[facility]  Chief Complaint  Patient presents with   Consult    Past Surgical History:  She  has a past surgical history that includes Cholecystectomy (1989); Tonsillectomy (childhood); Dilation and curettage of uterus (2009); Anterior and posterior repair (1994); Bunionectomy (JAN  2012  & SEPT 2010); LEFT RETROPERITONEAL LYMPH  NODE BX (12-25-2010); Cystoscopy with injection (10/28/2011); Colonoscopy; Total knee arthroplasty (04/26/2012); and Colonoscopy (N/A, 12/20/2012).  Past Medical History:  Arthritis, Depression, GERD, HLD, HTN, Migraine headache, Non-Hodgkin's lymphoma, Pneumonia, Hiatal hernia, Diverticulitis, Hypothyroidism  Constitutional:  BP 134/76   Pulse 86   Ht 5\' 4"  (1.626 m)   Wt 184 lb 6.4 oz (83.6 kg)   SpO2 92%   BMI 31.65 kg/m   Brief Summary:  Hailey [name] is a 77 y.o. female with obstructive sleep apnea.      Subjective:   She had a sleep study in 2009 that showed moderate sleep apnea.  She was tried on CPAP and previously followed by Dr. Marcelyn Bruins.  She was diagnosed with lymphoma and wasn't able to keep up with CPAP use.  She had a repeat sleep study in April of 2024, and this showed severe obstructive sleep apnea.  Her family says she snores, and her grandchildren won't sleep in the same room with her.  She is a side sleeper.  She has trouble falling asleep and staying asleep.  She can fall asleep during the day.  Her sleep schedule is random.  Some nights she goes to bed at midnight.  Other nights she stays awake until 6 am.  Depending on when she goes to sleep she will wake up after getting between 5 to 8 hours of sleep.  She wakes up several times to use the bathroom.  She feels tired in the morning.  She denies morning headache.  She does not use anything to help her fall sleep or stay awake.  She denies sleep walking, sleep talking, bruxism, or nightmares.  There is no history of restless legs.  She  denies sleep hallucinations, sleep paralysis, or cataplexy.  The Epworth score is 11 out of 24.   Physical Exam:   Appearance - well kempt   ENMT - no sinus tenderness, no oral exudate, no LAN, Mallampati 3 airway, no stridor  Respiratory - equal breath sounds bilaterally, no wheezing or rales  CV - s1s2 regular rate and rhythm, no murmurs  Ext - no clubbing, no edema  Skin - no rashes  Psych - normal mood and affect   Sleep Tests:  PSG 11/23/07 >> AHI 26, SpO2 low 82% HST 09/23/22 >> AHI 45.2, SpO2 low 80%  Social History:  She  reports that she has never smoked. She has never used smokeless tobacco. She reports that she does not drink alcohol and does not use drugs.  Family History:  Her family history includes Alzheimer's disease in her mother; Cancer in her mother; Gout in her father; Heart attack in her father; Heart disease in her mother; Hypertension in her father and mother; Leukemia in her brother.     Assessment/Plan:   Obstructive sleep apnea. - reviewed her sleep study - discussed how untreated sleep apnea can impact her health - treatment options reviewed - driving precautions discussed  - will arrange for auto CPAP set up  Poor sleep hygiene. - will get her started on CPAP and then reassess her sleep pattern  Nocturia. - discussed how treating sleep apnea can improve  this  Obesity. - discussed importance of weight loss  Time Spent Involved in Patient Care on Day of Examination:  51 minutes  Follow up:   Patient Instructions  Will arrange for new auto CPAP set up  Follow up in 4 months  Medication List:   Allergies as of 12/15/2022   No Known Allergies      Medication List        Accurate as of December 15, 2022  4:51 PM. If you have any questions, ask your nurse or doctor.          diltiazem 300 MG 24 hr capsule Commonly known as: TIAZAC Take 300 mg by mouth every morning.   metFORMIN 500 MG tablet Commonly known as:  GLUCOPHAGE Take 500 mg by mouth daily with breakfast.   omeprazole 20 MG capsule Commonly known as: PRILOSEC Take 20 mg by mouth every morning.   sertraline 50 MG tablet Commonly known as: ZOLOFT Take 50 mg by mouth every morning.   simvastatin 40 MG tablet Commonly known as: ZOCOR Take 40 mg by mouth every morning.   solifenacin 10 MG tablet Commonly known as: VESICARE Take 1 tablet by mouth once daily   Vitamin D-3 25 MCG (1000 UT) Caps Take 1,000 Units by mouth daily.        Signature:  Coralyn Helling, MD [facility] Pager - [phone] 12/15/2022, 4:51 PM

## 2022-12-15 NOTE — Patient Instructions (Signed)
Will arrange for new auto CPAP set up  Follow up in 4 months 

## 2022-12-24 DIAGNOSIS — R32 Unspecified urinary incontinence: Secondary | ICD-10-CM | POA: Diagnosis not present

## 2023-01-12 DIAGNOSIS — M1711 Unilateral primary osteoarthritis, right knee: Secondary | ICD-10-CM | POA: Diagnosis not present

## 2023-02-16 DIAGNOSIS — M1711 Unilateral primary osteoarthritis, right knee: Secondary | ICD-10-CM | POA: Diagnosis not present

## 2023-02-23 DIAGNOSIS — M1711 Unilateral primary osteoarthritis, right knee: Secondary | ICD-10-CM | POA: Diagnosis not present

## 2023-03-02 DIAGNOSIS — M1711 Unilateral primary osteoarthritis, right knee: Secondary | ICD-10-CM | POA: Diagnosis not present

## 2023-03-09 DIAGNOSIS — M1711 Unilateral primary osteoarthritis, right knee: Secondary | ICD-10-CM | POA: Diagnosis not present

## 2023-04-01 DIAGNOSIS — Z6832 Body mass index (BMI) 32.0-32.9, adult: Secondary | ICD-10-CM | POA: Diagnosis not present

## 2023-04-01 DIAGNOSIS — Z20828 Contact with and (suspected) exposure to other viral communicable diseases: Secondary | ICD-10-CM | POA: Diagnosis not present

## 2023-04-01 DIAGNOSIS — J329 Chronic sinusitis, unspecified: Secondary | ICD-10-CM | POA: Diagnosis not present

## 2023-04-10 DIAGNOSIS — C8200 Follicular lymphoma grade I, unspecified site: Secondary | ICD-10-CM | POA: Diagnosis not present

## 2023-04-17 DIAGNOSIS — D3131 Benign neoplasm of right choroid: Secondary | ICD-10-CM | POA: Diagnosis not present

## 2023-04-17 DIAGNOSIS — H353111 Nonexudative age-related macular degeneration, right eye, early dry stage: Secondary | ICD-10-CM | POA: Diagnosis not present

## 2023-05-04 DIAGNOSIS — E890 Postprocedural hypothyroidism: Secondary | ICD-10-CM | POA: Diagnosis not present

## 2023-05-04 DIAGNOSIS — E042 Nontoxic multinodular goiter: Secondary | ICD-10-CM | POA: Diagnosis not present

## 2023-06-08 DIAGNOSIS — Z8572 Personal history of non-Hodgkin lymphomas: Secondary | ICD-10-CM | POA: Diagnosis not present

## 2023-06-08 DIAGNOSIS — E785 Hyperlipidemia, unspecified: Secondary | ICD-10-CM | POA: Diagnosis not present

## 2023-06-08 DIAGNOSIS — Z1239 Encounter for other screening for malignant neoplasm of breast: Secondary | ICD-10-CM | POA: Diagnosis not present

## 2023-06-08 DIAGNOSIS — K219 Gastro-esophageal reflux disease without esophagitis: Secondary | ICD-10-CM | POA: Diagnosis not present

## 2023-06-08 DIAGNOSIS — I10 Essential (primary) hypertension: Secondary | ICD-10-CM | POA: Diagnosis not present

## 2023-06-08 DIAGNOSIS — Z0001 Encounter for general adult medical examination with abnormal findings: Secondary | ICD-10-CM | POA: Diagnosis not present

## 2023-06-08 DIAGNOSIS — E119 Type 2 diabetes mellitus without complications: Secondary | ICD-10-CM | POA: Diagnosis not present

## 2023-06-08 DIAGNOSIS — G43909 Migraine, unspecified, not intractable, without status migrainosus: Secondary | ICD-10-CM | POA: Diagnosis not present

## 2023-06-08 DIAGNOSIS — H5440 Blindness, one eye, unspecified eye: Secondary | ICD-10-CM | POA: Diagnosis not present

## 2023-06-29 ENCOUNTER — Encounter: Payer: Self-pay | Admitting: Primary Care

## 2023-06-29 ENCOUNTER — Ambulatory Visit: Payer: Medicare Other | Admitting: Primary Care

## 2023-06-29 VITALS — BP 124/83 | HR 89 | Ht 64.0 in | Wt 181.0 lb

## 2023-06-29 DIAGNOSIS — G4733 Obstructive sleep apnea (adult) (pediatric): Secondary | ICD-10-CM

## 2023-06-29 NOTE — Patient Instructions (Addendum)
-  OBSTRUCTIVE SLEEP APNEA: Obstructive sleep apnea is a condition where your airway becomes blocked during sleep, causing breathing pauses. You are doing well with your CPAP therapy, showing 93% compliance and a residual apnea score of 1.7, which indicates good control. To address the issues with mask seal and air leaks, we will continue your current CPAP settings (auto 5-15), order a chin strap to help keep your mouth closed during sleep, replace your nasal mask and headgear, and consider a full face mask if the air leaks persist.  INSTRUCTIONS: Please continue using your CPAP machine as directed. We will order a chin strap and replace your nasal mask and headgear. If you continue to experience air leaks, we may try a full face mask. Follow up in one year or sooner if you have any issues.  How often change CPAP supplies Disposable filters: Once a month. Nasal cushions & nasal pillows: Replace every 2-4 weeks. Full-face cushions: Replace every month. Tubing every 3 months. Headgear and water chamber every 6 months.   Follow-up 1 year with Waynetta Sandy NP

## 2023-06-29 NOTE — Progress Notes (Signed)
@Patient  ID: Hailey [name], female    DOB: March 23, 1946, 78 y.o.   MRN: [id number]  No chief complaint on file.   Referring provider: Selinda Flavin, MD  HPI: 78 year old female, never smoked.  Past significant for hypertension, obstructive sleep apnea, pneumonia, GERD, thyroid nodule, hyperlipidemia, nocturia, non-Hodgkin's lymphoma.  Former patient of Dr. Craige Cotta.  06/29/2023 Discussed the use of AI scribe software for clinical note transcription with the patient, who gave verbal consent to proceed.  History of Present Illness   The patient, diagnosed with sleep apnea in 2009, has been using a CPAP machine for management. She reports a history of lymphoma in 2015, during which she discontinued CPAP use due to difficulty managing the machine alongside her cancer treatment. The patient resumed CPAP use after a repeat sleep study in 2024 showed severe sleep apnea.  The patient's CPAP compliance report shows 93% compliance over the last 30 days, with an average usage of seven hours and eight minutes. The pressure settings are auto-adjusted between 5 to 15, with the patient often requiring the full pressure setting. The patient's residual apnea score is 1.7, indicating well-controlled sleep apnea. However, the patient reports occasional issues with mask seal and air leaks.  The patient's family initially noticed her sleep apnea symptoms, including snoring and periods of apnea during sleep. The patient reports that her snoring has decreased with CPAP use. She also reports frequent nocturia, which may be affecting her CPAP usage and mask seal due to frequent removal and reapplication of the mask during the night.  The patient uses a nasal mask for her CPAP and suspects she may sleep with her mouth open, which could contribute to the air leaks. She has been using the same mask and headgear for the past six months. The patient has not received any new supplies or replacements for her CPAP machine since its  acquisition six months ago.      Airview download 12/8/202-06/25/23 Usage days 28/30 days (93%) greater than 4 hours Usage 7 hours 8 minutes Pressure 5 to 15 cm H2O (14.2 cm H2O-95%) Air leaks 37.9 L/min (95%) and AHI 1.7   No Known Allergies  Immunization History  Administered Date(s) Administered   DTaP 06/10/2007   Influenza Split 03/09/2012   Influenza-Unspecified 02/07/2013, 03/09/2014   Moderna Sars-Covid-2 Vaccination 06/30/2019, 07/27/2019   Pneumococcal Conjugate-13 06/09/2006   Zoster, Live 06/09/2008    Past Medical History:  Diagnosis Date   Arthritis KNEES AND HIPS   Depression    Diverticulitis    Frequency of urination    GERD (gastroesophageal reflux disease)    Hiatal hernia    Hyperlipidemia    Hypertension    Hypothyroidism    Incontinence of urine    Insomnia    Intrinsic urethral sphincter deficiency    Migraine    hx of havent had one in 15 years   Nocturia    Non-Hodgkin lymphoma (HCC) DX  JULY 2012---  SMALL AND SLOW GROWING--  NO TX     FOLLOWED BY [facility]   OSA on CPAP    Pneumonia    Right thyroid nodule ACCIDENTAL FINDING FROM PET SCAN  2012    Tobacco History: Social History   Tobacco Use  Smoking Status Never  Smokeless Tobacco Never   Counseling given: Not Answered   Outpatient Medications Prior to Visit  Medication Sig Dispense Refill   Cholecalciferol (VITAMIN D-3) 1000 UNITS CAPS Take 1,000 Units by mouth daily.  diltiazem (TIAZAC) 300 MG 24 hr capsule Take 300 mg by mouth every morning.     metFORMIN (GLUCOPHAGE) 500 MG tablet Take 500 mg by mouth daily with breakfast.     omeprazole (PRILOSEC) 20 MG capsule Take 20 mg by mouth every morning.     sertraline (ZOLOFT) 50 MG tablet Take 50 mg by mouth every morning.     simvastatin (ZOCOR) 40 MG tablet Take 40 mg by mouth every morning.     solifenacin (VESICARE) 10 MG tablet Take 1 tablet by mouth once daily 90 tablet 0   No facility-administered  medications prior to visit.      Review of Systems  Review of Systems  Constitutional: Negative.   HENT: Negative.    Respiratory: Negative.    Cardiovascular: Negative.      Physical Exam  BP 124/83   Pulse 89   Ht 5\' 4"  (1.626 m)   Wt 181 lb (82.1 kg)   SpO2 94% Comment: room air  BMI 31.07 kg/m  Physical Exam Constitutional:      Appearance: Normal appearance.  HENT:     Head: Normocephalic and atraumatic.  Cardiovascular:     Rate and Rhythm: Normal rate and regular rhythm.  Pulmonary:     Effort: Pulmonary effort is normal.     Breath sounds: Normal breath sounds.  Skin:    General: Skin is warm and dry.  Neurological:     General: No focal deficit present.     Mental Status: She is alert and oriented to person, place, and time. Mental status is at baseline.  Psychiatric:        Mood and Affect: Mood normal.        Behavior: Behavior normal.        Thought Content: Thought content normal.        Judgment: Judgment normal.      Lab Results:  CBC    Component Value Date/Time   WBC 6.4 05/17/2012 1137   RBC 4.06 05/17/2012 1137   HGB 12.1 05/17/2012 1137   HCT 37.3 05/17/2012 1137   PLT 340.0 05/17/2012 1137   MCV 91.8 05/17/2012 1137   MCH 30.5 04/27/2012 0550   MCHC 32.4 05/17/2012 1137   RDW 14.0 05/17/2012 1137   LYMPHSABS 2.0 05/17/2012 1137   MONOABS 0.5 05/17/2012 1137   EOSABS 0.3 05/17/2012 1137   BASOSABS 0.0 05/17/2012 1137    BMET    Component Value Date/Time   NA 135 05/03/2012 1024   K 4.4 05/03/2012 1024   CL 98 05/03/2012 1024   CO2 27 05/03/2012 1024   GLUCOSE 120 (H) 05/03/2012 1024   BUN 20 05/03/2012 1024   CREATININE 0.9 05/03/2012 1024   CALCIUM 9.1 05/03/2012 1024   GFRNONAA 71 (L) 04/27/2012 0550   GFRAA 83 (L) 04/27/2012 0550    BNP No results found for: "BNP"  ProBNP    Component Value Date/Time   PROBNP 20.0 05/03/2012 1024    Imaging: No results found.   Assessment & Plan:   1. OSA  (obstructive sleep apnea) (Primary)     Obstructive Sleep Apnea Patient is compliant with CPAP use with 93% compliance over the past 30 days. The residual apnea score is 1.7, indicating good control of sleep apnea. However, there are some issues with mask seal and air leaks, potentially due to mouth breathing or mask fit. -Continue CPAP use with current settings (auto 5-15cm h20). -Order chin strap to help keep mouth closed during  sleep. -Reviewed recommended schedule for replacing CPAP supplies, DME order placed to replace nasal mask and headgear due to potential wear -Consider trying a full face mask if issues with air leaks persist. -Follow up in one year or sooner if issues arise.      30 min spent; >50% face to face with patient  Glenford Bayley, NP 06/29/2023

## 2023-07-02 ENCOUNTER — Encounter: Payer: Self-pay | Admitting: Primary Care

## 2023-07-20 ENCOUNTER — Telehealth: Payer: Self-pay | Admitting: Primary Care

## 2023-07-20 NOTE — Telephone Encounter (Signed)
 Spoke with patient to notify order was sent to [facility] on 06/29/2023, she states "she dosen't answer phone numbers that she dosen't know so she dosent' know if they have called her" number given [phone] to contact them to arrange for this. Pt verbalized understanding

## 2023-07-20 NOTE — Telephone Encounter (Signed)
 Patient states she saw Eulas Hick, NP about three weeks ago for her sleep apnea she was told by Nellie Banas that  she would order a chin strap to help keep your mouth closed during sleep, replace your nasal mask and headgear, and consider a full face mask if the air leaks persist.   Patient has not heard anything regarding this.  Patient call back [phone]

## 2023-09-28 DIAGNOSIS — K080 Exfoliation of teeth due to systemic causes: Secondary | ICD-10-CM | POA: Diagnosis not present

## 2023-09-30 DIAGNOSIS — G4733 Obstructive sleep apnea (adult) (pediatric): Secondary | ICD-10-CM | POA: Diagnosis not present

## 2023-10-06 DIAGNOSIS — K080 Exfoliation of teeth due to systemic causes: Secondary | ICD-10-CM | POA: Diagnosis not present

## 2023-11-06 DIAGNOSIS — H348121 Central retinal vein occlusion, left eye, with retinal neovascularization: Secondary | ICD-10-CM | POA: Diagnosis not present

## 2023-11-06 DIAGNOSIS — D3131 Benign neoplasm of right choroid: Secondary | ICD-10-CM | POA: Diagnosis not present

## 2023-11-06 DIAGNOSIS — E119 Type 2 diabetes mellitus without complications: Secondary | ICD-10-CM | POA: Diagnosis not present

## 2023-11-06 DIAGNOSIS — H5211 Myopia, right eye: Secondary | ICD-10-CM | POA: Diagnosis not present

## 2023-11-06 DIAGNOSIS — H43811 Vitreous degeneration, right eye: Secondary | ICD-10-CM | POA: Diagnosis not present

## 2023-11-06 DIAGNOSIS — H524 Presbyopia: Secondary | ICD-10-CM | POA: Diagnosis not present

## 2023-11-06 DIAGNOSIS — H52221 Regular astigmatism, right eye: Secondary | ICD-10-CM | POA: Diagnosis not present

## 2023-11-09 DIAGNOSIS — R8271 Bacteriuria: Secondary | ICD-10-CM | POA: Diagnosis not present

## 2023-11-09 DIAGNOSIS — R32 Unspecified urinary incontinence: Secondary | ICD-10-CM | POA: Diagnosis not present

## 2023-11-19 ENCOUNTER — Ambulatory Visit: Admitting: Nurse Practitioner

## 2023-11-19 ENCOUNTER — Encounter: Payer: Self-pay | Admitting: Nurse Practitioner

## 2023-11-19 VITALS — BP 150/79 | HR 72 | Ht 64.0 in | Wt 187.4 lb

## 2023-11-19 DIAGNOSIS — G4733 Obstructive sleep apnea (adult) (pediatric): Secondary | ICD-10-CM

## 2023-11-19 DIAGNOSIS — Z9989 Dependence on other enabling machines and devices: Secondary | ICD-10-CM

## 2023-11-19 NOTE — Patient Instructions (Addendum)
 Continue to use CPAP every night, minimum of 4-6 hours a night.  Change equipment as directed. Wash your tubing with warm soap and water  daily, hang to dry. Wash humidifier portion weekly. Use bottled, distilled water  and change daily Be aware of reduced alertness and do not drive or operate heavy machinery if experiencing this or drowsiness.  Exercise encouraged, as tolerated. Healthy weight management discussed.  Avoid or decrease alcohol  consumption and medications that make you more sleepy, if possible. Notify if persistent daytime sleepiness occurs even with consistent use of PAP therapy.  Every month Mask cushions and/or nasal pillows CPAP machine filters Every 3 months Mask frame (not including the headgear) CPAP tubing Every 6 months Mask headgear Chin strap (if applicable) Humidifier water  tub  Your CPAP download looked great! Your apnea is well managed and since you are feeling rested and like you're sleeping well, don't get too worried about the scores on your App :)  Call [employer] to order supplies. You need to go ahead and change your headstrap, tubing, mask, and water  chamber   Follow up in 1 year with Katie Lakashia Collison,NP. If symptoms worsen, please contact office for sooner follow up or seek emergency care.

## 2023-11-19 NOTE — Progress Notes (Signed)
 @Patient  ID: Hailey [name], female    DOB: 05/25/1946, 78 y.o.   MRN: [id number]  Chief Complaint  Patient presents with   Follow-up    Referring provider: Lauran Pollard, MD  HPI: 78 year old female, never smoker followed for OSA on CPAP. Past significant history significant for HTN, GERD, thyroid  nodule, HLD, nocturia, non-hodgkin's lymphoma. Former Dr. Matilde Son pt.   TEST/EVENTS:  11/23/2007 PSG: AHI 26, SpO2 low 82% 09/23/2022 HST: AHI 45.2/h, spO2 low 80%  06/29/2023: OV with Sueanne Emerald NP. Diagnosed with OSA in 2009. Using CPAP. Dx with lymphoma in 2015 so d/c CPAP then but resumed after repeat HST in 2024 showing severe OSA. Without CPAP, snoring, apnea. Still having frequent nocturia, which does impact CPAP usage and msk seal. Using nasal mask; concerned she sleeps with her mouth open. Has not changed supplies in >6 m. 93% >4 hr on download with av use 7 hr 8 min, AHI 1.7. Ordered chin strap. Rx new supplies. Consider FFM if leaks persist.   11/19/2023: Today - follow up Patient presents today for follow up. She was seen in February and recommendation was for a year follow up. She wanted to come in today to discuss her concerns about her scores on her My[employer] App from her CPAP. She tells me she's getting 70's some nights and that was concerning her. She is still having moderate leaks but they aren't bothersome to her and don't wake her up. The only time it really impacts her is when she's trying to go to sleep and has to keep fidgeting with it. She hasn't changed her headgear out in over 6 months and has never changed the tube or the water  chamber. She never received supplies or a chin strap from her DME. She is doing well with the CPAP otherwise. Feels like her sleep is restful and energy levels are good during the day. No issues with drowsy driving or morning headaches.  10/19/2023-11/17/2023: CPAP 5-15 cmH2O 29/30 days; 90% >4 hr; average use 7 hr 15 min Pressure 95th 14.4 Leaks 95th  61.4 AHI 1.9  No Known Allergies  Immunization History  Administered Date(s) Administered   DTaP 06/10/2007   Influenza Split 03/09/2012   Influenza-Unspecified 02/07/2013, 03/09/2014, 04/08/2018   Moderna Sars-Covid-2 Vaccination 06/30/2019, 07/27/2019   PNEUMOCOCCAL CONJUGATE-20 06/05/2022   Pneumococcal Conjugate-13 06/09/2006   Rsv, Bivalent, Protein Subunit Rsvpref,pf Pattricia Bores) 06/05/2022   Zoster, Live 06/09/2008    Past Medical History:  Diagnosis Date   Arthritis KNEES AND HIPS   Depression    Diverticulitis    Frequency of urination    GERD (gastroesophageal reflux disease)    Hiatal hernia    Hyperlipidemia    Hypertension    Hypothyroidism    Incontinence of urine    Insomnia    Intrinsic urethral sphincter deficiency    Migraine    hx of havent had one in 15 years   Nocturia    Non-Hodgkin lymphoma (HCC) DX  JULY 2012---  SMALL AND SLOW GROWING--  NO TX     FOLLOWED BY [facility]   OSA on CPAP    Pneumonia    Right thyroid  nodule ACCIDENTAL FINDING FROM PET SCAN  2012    Tobacco History: Social History   Tobacco Use  Smoking Status Never  Smokeless Tobacco Never   Counseling given: Not Answered   Outpatient Medications Prior to Visit  Medication Sig Dispense Refill   Cholecalciferol  (VITAMIN D -3) 1000 UNITS CAPS Take 1,000  Units by mouth daily.     diltiazem  (TIAZAC ) 300 MG 24 hr capsule Take 300 mg by mouth every morning.     metFORMIN (GLUCOPHAGE) 500 MG tablet Take 500 mg by mouth daily with breakfast.     omeprazole (PRILOSEC) 20 MG capsule Take 20 mg by mouth every morning.     sertraline  (ZOLOFT ) 50 MG tablet Take 50 mg by mouth every morning.     simvastatin  (ZOCOR ) 40 MG tablet Take 40 mg by mouth every morning.     solifenacin (VESICARE) 10 MG tablet Take 1 tablet by mouth once daily 90 tablet 0   Vibegron (GEMTESA) 75 MG TABS Take by mouth.     No facility-administered medications prior to visit.     Review of  Systems:   Constitutional: No weight loss or gain, night sweats, fevers, chills, fatigue, or lassitude. HEENT: No headaches, difficulty swallowing, tooth/dental problems, or sore throat. No sneezing, itching, ear ache, nasal congestion, or post nasal drip CV:  No chest pain, orthopnea, PND, swelling in lower extremities, anasarca, dizziness, palpitations, syncope Resp: +snoring (without CPAP). No shortness of breath with exertion or at rest. No cough GI:  No heartburn, indigestion GU: No dysuria, change in color of urine, urgency or frequency.  +nocturia  Neuro: No dizziness or lightheadedness.  Psych: No depression or anxiety. Mood stable.     Physical Exam:  BP (!) 150/79 (BP Location: Left Arm)   Pulse 72   Ht 5' 4 (1.626 m)   Wt 187 lb 6.4 oz (85 kg)   SpO2 95% Comment: RA  BMI 32.17 kg/m   GEN: Pleasant, interactive, well-appearing; obese; in no acute distress HEENT:  Normocephalic and atraumatic. PERRLA. Sclera white. Nasal turbinates pink, moist and patent bilaterally. No rhinorrhea present. Oropharynx pink and moist, without exudate or edema. No lesions, ulcerations, or postnasal drip.  NECK:  Supple w/ fair ROM. No lymphadenopathy.   CV: RRR, no m/r/g, no peripheral edema. Pulses intact, +2 bilaterally. No cyanosis, pallor or clubbing. PULMONARY:  Unlabored, regular breathing. Clear bilaterally A&P w/o wheezes/rales/rhonchi. No accessory muscle use.  GI: BS present and normoactive. Soft, non-tender to palpation. No organomegaly or masses detected.  MSK: No erythema, warmth or tenderness. Cap refil <2 sec all extrem. No deformities or joint swelling noted.  Neuro: A/Ox3. No focal deficits noted.   Skin: Warm, no lesions or rashe Psych: Normal affect and behavior. Judgement and thought content appropriate.     Lab Results:  CBC    Component Value Date/Time   WBC 6.4 05/17/2012 1137   RBC 4.06 05/17/2012 1137   HGB 12.1 05/17/2012 1137   HCT 37.3 05/17/2012 1137    PLT 340.0 05/17/2012 1137   MCV 91.8 05/17/2012 1137   MCH 30.5 04/27/2012 0550   MCHC 32.4 05/17/2012 1137   RDW 14.0 05/17/2012 1137   LYMPHSABS 2.0 05/17/2012 1137   MONOABS 0.5 05/17/2012 1137   EOSABS 0.3 05/17/2012 1137   BASOSABS 0.0 05/17/2012 1137    BMET    Component Value Date/Time   NA 135 05/03/2012 1024   K 4.4 05/03/2012 1024   CL 98 05/03/2012 1024   CO2 27 05/03/2012 1024   GLUCOSE 120 (H) 05/03/2012 1024   BUN 20 05/03/2012 1024   CREATININE 0.9 05/03/2012 1024   CALCIUM  9.1 05/03/2012 1024   GFRNONAA 71 (L) 04/27/2012 0550   GFRAA 83 (L) 04/27/2012 0550    BNP No results found for: BNP   Imaging:  No results  found.  Administration History     None           No data to display          No results found for: NITRICOXIDE      Assessment & Plan:   OSA (obstructive sleep apnea) Severe OSA on CPAP. Excellent compliance and control. Educated that her lower scores are likely due to leaks but this is not impacting her symptoms or her overall control. Advised to not hyperfixate on the scores and focus more so on symptoms. She is currently receiving benefit from use. Re-educated on proper care/use of device. Provided with contact info for [employer] to order supplies. Aware of risks of untreated OSA. Safe driving practices reviewed.  Patient Instructions  Continue to use CPAP every night, minimum of 4-6 hours a night.  Change equipment as directed. Wash your tubing with warm soap and water  daily, hang to dry. Wash humidifier portion weekly. Use bottled, distilled water  and change daily Be aware of reduced alertness and do not drive or operate heavy machinery if experiencing this or drowsiness.  Exercise encouraged, as tolerated. Healthy weight management discussed.  Avoid or decrease alcohol  consumption and medications that make you more sleepy, if possible. Notify if persistent daytime sleepiness occurs even with consistent use of PAP  therapy.  Every month Mask cushions and/or nasal pillows CPAP machine filters Every 3 months Mask frame (not including the headgear) CPAP tubing Every 6 months Mask headgear Chin strap (if applicable) Humidifier water  tub  Your CPAP download looked great! Your apnea is well managed and since you are feeling rested and like you're sleeping well, don't get too worried about the scores on your App :)  Call [employer] to order supplies. You need to go ahead and change your headstrap, tubing, mask, and water  chamber   Follow up in 1 year with Katie Aamna Mallozzi,NP. If symptoms worsen, please contact office for sooner follow up or seek emergency care.    Advised if symptoms do not improve or worsen, to please contact office for sooner follow up or seek emergency care.   I spent 35 minutes of dedicated to the care of this patient on the date of this encounter to include pre-visit review of records, face-to-face time with the patient discussing conditions above, post visit ordering of testing, clinical documentation with the electronic health record, making appropriate referrals as documented, and communicating necessary findings to members of the patients care team.  Roetta Clarke, NP 11/19/2023  Pt aware and understands NP's role.

## 2023-11-19 NOTE — Assessment & Plan Note (Signed)
 Severe OSA on CPAP. Excellent compliance and control. Educated that her lower scores are likely due to leaks but this is not impacting her symptoms or her overall control. Advised to not hyperfixate on the scores and focus more so on symptoms. She is currently receiving benefit from use. Re-educated on proper care/use of device. Provided with contact info for [employer] to order supplies. Aware of risks of untreated OSA. Safe driving practices reviewed.  Patient Instructions  Continue to use CPAP every night, minimum of 4-6 hours a night.  Change equipment as directed. Wash your tubing with warm soap and water  daily, hang to dry. Wash humidifier portion weekly. Use bottled, distilled water  and change daily Be aware of reduced alertness and do not drive or operate heavy machinery if experiencing this or drowsiness.  Exercise encouraged, as tolerated. Healthy weight management discussed.  Avoid or decrease alcohol  consumption and medications that make you more sleepy, if possible. Notify if persistent daytime sleepiness occurs even with consistent use of PAP therapy.  Every month Mask cushions and/or nasal pillows CPAP machine filters Every 3 months Mask frame (not including the headgear) CPAP tubing Every 6 months Mask headgear Chin strap (if applicable) Humidifier water  tub  Your CPAP download looked great! Your apnea is well managed and since you are feeling rested and like you're sleeping well, don't get too worried about the scores on your App :)  Call [employer] to order supplies. You need to go ahead and change your headstrap, tubing, mask, and water  chamber   Follow up in 1 year with Hailey Quintan Saldivar,NP. If symptoms worsen, please contact office for sooner follow up or seek emergency care.

## 2023-11-26 DIAGNOSIS — M1711 Unilateral primary osteoarthritis, right knee: Secondary | ICD-10-CM | POA: Diagnosis not present

## 2023-11-26 DIAGNOSIS — M25561 Pain in right knee: Secondary | ICD-10-CM | POA: Diagnosis not present

## 2023-11-30 DIAGNOSIS — Z1329 Encounter for screening for other suspected endocrine disorder: Secondary | ICD-10-CM | POA: Diagnosis not present

## 2023-11-30 DIAGNOSIS — E1165 Type 2 diabetes mellitus with hyperglycemia: Secondary | ICD-10-CM | POA: Diagnosis not present

## 2023-11-30 DIAGNOSIS — K769 Liver disease, unspecified: Secondary | ICD-10-CM | POA: Diagnosis not present

## 2023-12-07 DIAGNOSIS — Z8572 Personal history of non-Hodgkin lymphomas: Secondary | ICD-10-CM | POA: Diagnosis not present

## 2023-12-07 DIAGNOSIS — Z9889 Other specified postprocedural states: Secondary | ICD-10-CM | POA: Diagnosis not present

## 2023-12-07 DIAGNOSIS — Z6831 Body mass index (BMI) 31.0-31.9, adult: Secondary | ICD-10-CM | POA: Diagnosis not present

## 2023-12-07 DIAGNOSIS — Z9089 Acquired absence of other organs: Secondary | ICD-10-CM | POA: Diagnosis not present

## 2023-12-07 DIAGNOSIS — G4733 Obstructive sleep apnea (adult) (pediatric): Secondary | ICD-10-CM | POA: Diagnosis not present

## 2023-12-19 DIAGNOSIS — G4733 Obstructive sleep apnea (adult) (pediatric): Secondary | ICD-10-CM | POA: Diagnosis not present

## 2023-12-25 DIAGNOSIS — M1711 Unilateral primary osteoarthritis, right knee: Secondary | ICD-10-CM | POA: Diagnosis not present

## 2024-01-19 DIAGNOSIS — G4733 Obstructive sleep apnea (adult) (pediatric): Secondary | ICD-10-CM | POA: Diagnosis not present

## 2024-02-02 DIAGNOSIS — E042 Nontoxic multinodular goiter: Secondary | ICD-10-CM | POA: Diagnosis not present

## 2024-02-02 DIAGNOSIS — G4733 Obstructive sleep apnea (adult) (pediatric): Secondary | ICD-10-CM | POA: Diagnosis not present

## 2024-02-02 DIAGNOSIS — Z01818 Encounter for other preprocedural examination: Secondary | ICD-10-CM | POA: Diagnosis not present

## 2024-02-02 DIAGNOSIS — M1711 Unilateral primary osteoarthritis, right knee: Secondary | ICD-10-CM | POA: Diagnosis not present

## 2024-02-02 DIAGNOSIS — N3281 Overactive bladder: Secondary | ICD-10-CM | POA: Diagnosis not present

## 2024-02-02 DIAGNOSIS — I10 Essential (primary) hypertension: Secondary | ICD-10-CM | POA: Diagnosis not present

## 2024-02-02 DIAGNOSIS — E890 Postprocedural hypothyroidism: Secondary | ICD-10-CM | POA: Diagnosis not present

## 2024-02-02 DIAGNOSIS — E119 Type 2 diabetes mellitus without complications: Secondary | ICD-10-CM | POA: Diagnosis not present

## 2024-02-02 DIAGNOSIS — F32A Depression, unspecified: Secondary | ICD-10-CM | POA: Diagnosis not present

## 2024-02-02 DIAGNOSIS — E7800 Pure hypercholesterolemia, unspecified: Secondary | ICD-10-CM | POA: Diagnosis not present

## 2024-02-02 DIAGNOSIS — F419 Anxiety disorder, unspecified: Secondary | ICD-10-CM | POA: Diagnosis not present

## 2024-02-02 DIAGNOSIS — C8200 Follicular lymphoma grade I, unspecified site: Secondary | ICD-10-CM | POA: Diagnosis not present

## 2024-02-02 DIAGNOSIS — K219 Gastro-esophageal reflux disease without esophagitis: Secondary | ICD-10-CM | POA: Diagnosis not present

## 2024-02-18 DIAGNOSIS — Z471 Aftercare following joint replacement surgery: Secondary | ICD-10-CM | POA: Diagnosis not present

## 2024-02-18 DIAGNOSIS — M25661 Stiffness of right knee, not elsewhere classified: Secondary | ICD-10-CM | POA: Diagnosis not present

## 2024-02-18 DIAGNOSIS — G4733 Obstructive sleep apnea (adult) (pediatric): Secondary | ICD-10-CM | POA: Diagnosis not present

## 2024-02-18 DIAGNOSIS — K449 Diaphragmatic hernia without obstruction or gangrene: Secondary | ICD-10-CM | POA: Diagnosis not present

## 2024-02-18 DIAGNOSIS — Z79890 Hormone replacement therapy (postmenopausal): Secondary | ICD-10-CM | POA: Diagnosis not present

## 2024-02-18 DIAGNOSIS — Z6832 Body mass index (BMI) 32.0-32.9, adult: Secondary | ICD-10-CM | POA: Diagnosis not present

## 2024-02-18 DIAGNOSIS — Z79899 Other long term (current) drug therapy: Secondary | ICD-10-CM | POA: Diagnosis not present

## 2024-02-18 DIAGNOSIS — F32A Depression, unspecified: Secondary | ICD-10-CM | POA: Diagnosis not present

## 2024-02-18 DIAGNOSIS — M25761 Osteophyte, right knee: Secondary | ICD-10-CM | POA: Diagnosis not present

## 2024-02-18 DIAGNOSIS — E785 Hyperlipidemia, unspecified: Secondary | ICD-10-CM | POA: Diagnosis not present

## 2024-02-18 DIAGNOSIS — Z96652 Presence of left artificial knee joint: Secondary | ICD-10-CM | POA: Diagnosis not present

## 2024-02-18 DIAGNOSIS — E119 Type 2 diabetes mellitus without complications: Secondary | ICD-10-CM | POA: Diagnosis not present

## 2024-02-18 DIAGNOSIS — F419 Anxiety disorder, unspecified: Secondary | ICD-10-CM | POA: Diagnosis not present

## 2024-02-18 DIAGNOSIS — E042 Nontoxic multinodular goiter: Secondary | ICD-10-CM | POA: Diagnosis not present

## 2024-02-18 DIAGNOSIS — M6281 Muscle weakness (generalized): Secondary | ICD-10-CM | POA: Diagnosis not present

## 2024-02-18 DIAGNOSIS — M1711 Unilateral primary osteoarthritis, right knee: Secondary | ICD-10-CM | POA: Diagnosis not present

## 2024-02-18 DIAGNOSIS — E039 Hypothyroidism, unspecified: Secondary | ICD-10-CM | POA: Diagnosis not present

## 2024-02-18 DIAGNOSIS — E669 Obesity, unspecified: Secondary | ICD-10-CM | POA: Diagnosis not present

## 2024-02-18 DIAGNOSIS — Z8572 Personal history of non-Hodgkin lymphomas: Secondary | ICD-10-CM | POA: Diagnosis not present

## 2024-02-18 DIAGNOSIS — Z7984 Long term (current) use of oral hypoglycemic drugs: Secondary | ICD-10-CM | POA: Diagnosis not present

## 2024-02-18 DIAGNOSIS — I10 Essential (primary) hypertension: Secondary | ICD-10-CM | POA: Diagnosis not present

## 2024-02-18 DIAGNOSIS — Z96651 Presence of right artificial knee joint: Secondary | ICD-10-CM | POA: Diagnosis not present

## 2024-02-18 DIAGNOSIS — R2689 Other abnormalities of gait and mobility: Secondary | ICD-10-CM | POA: Diagnosis not present

## 2024-02-18 DIAGNOSIS — K219 Gastro-esophageal reflux disease without esophagitis: Secondary | ICD-10-CM | POA: Diagnosis not present

## 2024-02-18 DIAGNOSIS — G8918 Other acute postprocedural pain: Secondary | ICD-10-CM | POA: Diagnosis not present

## 2024-02-19 DIAGNOSIS — E039 Hypothyroidism, unspecified: Secondary | ICD-10-CM | POA: Diagnosis not present

## 2024-02-19 DIAGNOSIS — K219 Gastro-esophageal reflux disease without esophagitis: Secondary | ICD-10-CM | POA: Diagnosis not present

## 2024-02-19 DIAGNOSIS — R2689 Other abnormalities of gait and mobility: Secondary | ICD-10-CM | POA: Diagnosis not present

## 2024-02-19 DIAGNOSIS — K449 Diaphragmatic hernia without obstruction or gangrene: Secondary | ICD-10-CM | POA: Diagnosis not present

## 2024-02-19 DIAGNOSIS — Z79899 Other long term (current) drug therapy: Secondary | ICD-10-CM | POA: Diagnosis not present

## 2024-02-19 DIAGNOSIS — E785 Hyperlipidemia, unspecified: Secondary | ICD-10-CM | POA: Diagnosis not present

## 2024-02-19 DIAGNOSIS — Z8572 Personal history of non-Hodgkin lymphomas: Secondary | ICD-10-CM | POA: Diagnosis not present

## 2024-02-19 DIAGNOSIS — Z7984 Long term (current) use of oral hypoglycemic drugs: Secondary | ICD-10-CM | POA: Diagnosis not present

## 2024-02-19 DIAGNOSIS — F32A Depression, unspecified: Secondary | ICD-10-CM | POA: Diagnosis not present

## 2024-02-19 DIAGNOSIS — E669 Obesity, unspecified: Secondary | ICD-10-CM | POA: Diagnosis not present

## 2024-02-19 DIAGNOSIS — E042 Nontoxic multinodular goiter: Secondary | ICD-10-CM | POA: Diagnosis not present

## 2024-02-19 DIAGNOSIS — I10 Essential (primary) hypertension: Secondary | ICD-10-CM | POA: Diagnosis not present

## 2024-02-19 DIAGNOSIS — E119 Type 2 diabetes mellitus without complications: Secondary | ICD-10-CM | POA: Diagnosis not present

## 2024-02-19 DIAGNOSIS — M6281 Muscle weakness (generalized): Secondary | ICD-10-CM | POA: Diagnosis not present

## 2024-02-19 DIAGNOSIS — G4733 Obstructive sleep apnea (adult) (pediatric): Secondary | ICD-10-CM | POA: Diagnosis not present

## 2024-02-19 DIAGNOSIS — Z96652 Presence of left artificial knee joint: Secondary | ICD-10-CM | POA: Diagnosis not present

## 2024-02-19 DIAGNOSIS — M1711 Unilateral primary osteoarthritis, right knee: Secondary | ICD-10-CM | POA: Diagnosis not present

## 2024-02-19 DIAGNOSIS — M25761 Osteophyte, right knee: Secondary | ICD-10-CM | POA: Diagnosis not present

## 2024-02-19 DIAGNOSIS — Z6832 Body mass index (BMI) 32.0-32.9, adult: Secondary | ICD-10-CM | POA: Diagnosis not present

## 2024-02-19 DIAGNOSIS — F419 Anxiety disorder, unspecified: Secondary | ICD-10-CM | POA: Diagnosis not present

## 2024-02-19 DIAGNOSIS — Z79890 Hormone replacement therapy (postmenopausal): Secondary | ICD-10-CM | POA: Diagnosis not present

## 2024-02-19 DIAGNOSIS — M25661 Stiffness of right knee, not elsewhere classified: Secondary | ICD-10-CM | POA: Diagnosis not present

## 2024-02-20 DIAGNOSIS — F32A Depression, unspecified: Secondary | ICD-10-CM | POA: Diagnosis not present

## 2024-02-20 DIAGNOSIS — I10 Essential (primary) hypertension: Secondary | ICD-10-CM | POA: Diagnosis not present

## 2024-02-20 DIAGNOSIS — E079 Disorder of thyroid, unspecified: Secondary | ICD-10-CM | POA: Diagnosis not present

## 2024-02-20 DIAGNOSIS — Z96653 Presence of artificial knee joint, bilateral: Secondary | ICD-10-CM | POA: Diagnosis not present

## 2024-02-20 DIAGNOSIS — Z471 Aftercare following joint replacement surgery: Secondary | ICD-10-CM | POA: Diagnosis not present

## 2024-02-20 DIAGNOSIS — E042 Nontoxic multinodular goiter: Secondary | ICD-10-CM | POA: Diagnosis not present

## 2024-02-20 DIAGNOSIS — Z9049 Acquired absence of other specified parts of digestive tract: Secondary | ICD-10-CM | POA: Diagnosis not present

## 2024-02-20 DIAGNOSIS — K219 Gastro-esophageal reflux disease without esophagitis: Secondary | ICD-10-CM | POA: Diagnosis not present

## 2024-02-20 DIAGNOSIS — Z79890 Hormone replacement therapy (postmenopausal): Secondary | ICD-10-CM | POA: Diagnosis not present

## 2024-02-20 DIAGNOSIS — E785 Hyperlipidemia, unspecified: Secondary | ICD-10-CM | POA: Diagnosis not present

## 2024-02-20 DIAGNOSIS — E119 Type 2 diabetes mellitus without complications: Secondary | ICD-10-CM | POA: Diagnosis not present

## 2024-02-20 DIAGNOSIS — Z8572 Personal history of non-Hodgkin lymphomas: Secondary | ICD-10-CM | POA: Diagnosis not present

## 2024-02-20 DIAGNOSIS — R32 Unspecified urinary incontinence: Secondary | ICD-10-CM | POA: Diagnosis not present

## 2024-02-20 DIAGNOSIS — Z7984 Long term (current) use of oral hypoglycemic drugs: Secondary | ICD-10-CM | POA: Diagnosis not present

## 2024-02-20 DIAGNOSIS — Z7982 Long term (current) use of aspirin: Secondary | ICD-10-CM | POA: Diagnosis not present

## 2024-02-20 DIAGNOSIS — G4733 Obstructive sleep apnea (adult) (pediatric): Secondary | ICD-10-CM | POA: Diagnosis not present

## 2024-02-20 DIAGNOSIS — F419 Anxiety disorder, unspecified: Secondary | ICD-10-CM | POA: Diagnosis not present

## 2024-02-22 DIAGNOSIS — Z471 Aftercare following joint replacement surgery: Secondary | ICD-10-CM | POA: Diagnosis not present

## 2024-02-22 DIAGNOSIS — Z79890 Hormone replacement therapy (postmenopausal): Secondary | ICD-10-CM | POA: Diagnosis not present

## 2024-02-22 DIAGNOSIS — E119 Type 2 diabetes mellitus without complications: Secondary | ICD-10-CM | POA: Diagnosis not present

## 2024-02-22 DIAGNOSIS — I10 Essential (primary) hypertension: Secondary | ICD-10-CM | POA: Diagnosis not present

## 2024-02-22 DIAGNOSIS — R32 Unspecified urinary incontinence: Secondary | ICD-10-CM | POA: Diagnosis not present

## 2024-02-22 DIAGNOSIS — E079 Disorder of thyroid, unspecified: Secondary | ICD-10-CM | POA: Diagnosis not present

## 2024-02-22 DIAGNOSIS — Z7982 Long term (current) use of aspirin: Secondary | ICD-10-CM | POA: Diagnosis not present

## 2024-02-22 DIAGNOSIS — Z8572 Personal history of non-Hodgkin lymphomas: Secondary | ICD-10-CM | POA: Diagnosis not present

## 2024-02-22 DIAGNOSIS — E042 Nontoxic multinodular goiter: Secondary | ICD-10-CM | POA: Diagnosis not present

## 2024-02-22 DIAGNOSIS — F419 Anxiety disorder, unspecified: Secondary | ICD-10-CM | POA: Diagnosis not present

## 2024-02-22 DIAGNOSIS — G4733 Obstructive sleep apnea (adult) (pediatric): Secondary | ICD-10-CM | POA: Diagnosis not present

## 2024-02-22 DIAGNOSIS — E785 Hyperlipidemia, unspecified: Secondary | ICD-10-CM | POA: Diagnosis not present

## 2024-02-22 DIAGNOSIS — F32A Depression, unspecified: Secondary | ICD-10-CM | POA: Diagnosis not present

## 2024-02-22 DIAGNOSIS — Z9049 Acquired absence of other specified parts of digestive tract: Secondary | ICD-10-CM | POA: Diagnosis not present

## 2024-02-22 DIAGNOSIS — K219 Gastro-esophageal reflux disease without esophagitis: Secondary | ICD-10-CM | POA: Diagnosis not present

## 2024-02-22 DIAGNOSIS — Z96653 Presence of artificial knee joint, bilateral: Secondary | ICD-10-CM | POA: Diagnosis not present

## 2024-02-22 DIAGNOSIS — Z7984 Long term (current) use of oral hypoglycemic drugs: Secondary | ICD-10-CM | POA: Diagnosis not present

## 2024-02-24 DIAGNOSIS — Z7982 Long term (current) use of aspirin: Secondary | ICD-10-CM | POA: Diagnosis not present

## 2024-02-24 DIAGNOSIS — E119 Type 2 diabetes mellitus without complications: Secondary | ICD-10-CM | POA: Diagnosis not present

## 2024-02-24 DIAGNOSIS — Z96653 Presence of artificial knee joint, bilateral: Secondary | ICD-10-CM | POA: Diagnosis not present

## 2024-02-24 DIAGNOSIS — Z7984 Long term (current) use of oral hypoglycemic drugs: Secondary | ICD-10-CM | POA: Diagnosis not present

## 2024-02-24 DIAGNOSIS — F32A Depression, unspecified: Secondary | ICD-10-CM | POA: Diagnosis not present

## 2024-02-24 DIAGNOSIS — F419 Anxiety disorder, unspecified: Secondary | ICD-10-CM | POA: Diagnosis not present

## 2024-02-24 DIAGNOSIS — K219 Gastro-esophageal reflux disease without esophagitis: Secondary | ICD-10-CM | POA: Diagnosis not present

## 2024-02-24 DIAGNOSIS — Z471 Aftercare following joint replacement surgery: Secondary | ICD-10-CM | POA: Diagnosis not present

## 2024-02-24 DIAGNOSIS — E042 Nontoxic multinodular goiter: Secondary | ICD-10-CM | POA: Diagnosis not present

## 2024-02-24 DIAGNOSIS — E079 Disorder of thyroid, unspecified: Secondary | ICD-10-CM | POA: Diagnosis not present

## 2024-02-24 DIAGNOSIS — Z79890 Hormone replacement therapy (postmenopausal): Secondary | ICD-10-CM | POA: Diagnosis not present

## 2024-02-24 DIAGNOSIS — E785 Hyperlipidemia, unspecified: Secondary | ICD-10-CM | POA: Diagnosis not present

## 2024-02-24 DIAGNOSIS — I10 Essential (primary) hypertension: Secondary | ICD-10-CM | POA: Diagnosis not present

## 2024-02-24 DIAGNOSIS — R32 Unspecified urinary incontinence: Secondary | ICD-10-CM | POA: Diagnosis not present

## 2024-02-24 DIAGNOSIS — G4733 Obstructive sleep apnea (adult) (pediatric): Secondary | ICD-10-CM | POA: Diagnosis not present

## 2024-02-24 DIAGNOSIS — Z8572 Personal history of non-Hodgkin lymphomas: Secondary | ICD-10-CM | POA: Diagnosis not present

## 2024-02-24 DIAGNOSIS — Z9049 Acquired absence of other specified parts of digestive tract: Secondary | ICD-10-CM | POA: Diagnosis not present

## 2024-02-26 DIAGNOSIS — I10 Essential (primary) hypertension: Secondary | ICD-10-CM | POA: Diagnosis not present

## 2024-02-26 DIAGNOSIS — Z96653 Presence of artificial knee joint, bilateral: Secondary | ICD-10-CM | POA: Diagnosis not present

## 2024-02-26 DIAGNOSIS — Z8572 Personal history of non-Hodgkin lymphomas: Secondary | ICD-10-CM | POA: Diagnosis not present

## 2024-02-26 DIAGNOSIS — Z79890 Hormone replacement therapy (postmenopausal): Secondary | ICD-10-CM | POA: Diagnosis not present

## 2024-02-26 DIAGNOSIS — F32A Depression, unspecified: Secondary | ICD-10-CM | POA: Diagnosis not present

## 2024-02-26 DIAGNOSIS — Z9049 Acquired absence of other specified parts of digestive tract: Secondary | ICD-10-CM | POA: Diagnosis not present

## 2024-02-26 DIAGNOSIS — E042 Nontoxic multinodular goiter: Secondary | ICD-10-CM | POA: Diagnosis not present

## 2024-02-26 DIAGNOSIS — Z471 Aftercare following joint replacement surgery: Secondary | ICD-10-CM | POA: Diagnosis not present

## 2024-02-26 DIAGNOSIS — K219 Gastro-esophageal reflux disease without esophagitis: Secondary | ICD-10-CM | POA: Diagnosis not present

## 2024-02-26 DIAGNOSIS — E079 Disorder of thyroid, unspecified: Secondary | ICD-10-CM | POA: Diagnosis not present

## 2024-02-26 DIAGNOSIS — R32 Unspecified urinary incontinence: Secondary | ICD-10-CM | POA: Diagnosis not present

## 2024-02-26 DIAGNOSIS — Z7984 Long term (current) use of oral hypoglycemic drugs: Secondary | ICD-10-CM | POA: Diagnosis not present

## 2024-02-26 DIAGNOSIS — E785 Hyperlipidemia, unspecified: Secondary | ICD-10-CM | POA: Diagnosis not present

## 2024-02-26 DIAGNOSIS — E119 Type 2 diabetes mellitus without complications: Secondary | ICD-10-CM | POA: Diagnosis not present

## 2024-02-26 DIAGNOSIS — G4733 Obstructive sleep apnea (adult) (pediatric): Secondary | ICD-10-CM | POA: Diagnosis not present

## 2024-02-26 DIAGNOSIS — Z7982 Long term (current) use of aspirin: Secondary | ICD-10-CM | POA: Diagnosis not present

## 2024-02-26 DIAGNOSIS — F419 Anxiety disorder, unspecified: Secondary | ICD-10-CM | POA: Diagnosis not present

## 2024-02-29 DIAGNOSIS — Z471 Aftercare following joint replacement surgery: Secondary | ICD-10-CM | POA: Diagnosis not present

## 2024-02-29 DIAGNOSIS — Z7984 Long term (current) use of oral hypoglycemic drugs: Secondary | ICD-10-CM | POA: Diagnosis not present

## 2024-02-29 DIAGNOSIS — Z7982 Long term (current) use of aspirin: Secondary | ICD-10-CM | POA: Diagnosis not present

## 2024-02-29 DIAGNOSIS — F32A Depression, unspecified: Secondary | ICD-10-CM | POA: Diagnosis not present

## 2024-02-29 DIAGNOSIS — F419 Anxiety disorder, unspecified: Secondary | ICD-10-CM | POA: Diagnosis not present

## 2024-02-29 DIAGNOSIS — I10 Essential (primary) hypertension: Secondary | ICD-10-CM | POA: Diagnosis not present

## 2024-02-29 DIAGNOSIS — Z8572 Personal history of non-Hodgkin lymphomas: Secondary | ICD-10-CM | POA: Diagnosis not present

## 2024-02-29 DIAGNOSIS — E042 Nontoxic multinodular goiter: Secondary | ICD-10-CM | POA: Diagnosis not present

## 2024-02-29 DIAGNOSIS — Z96653 Presence of artificial knee joint, bilateral: Secondary | ICD-10-CM | POA: Diagnosis not present

## 2024-02-29 DIAGNOSIS — E785 Hyperlipidemia, unspecified: Secondary | ICD-10-CM | POA: Diagnosis not present

## 2024-02-29 DIAGNOSIS — G4733 Obstructive sleep apnea (adult) (pediatric): Secondary | ICD-10-CM | POA: Diagnosis not present

## 2024-02-29 DIAGNOSIS — E119 Type 2 diabetes mellitus without complications: Secondary | ICD-10-CM | POA: Diagnosis not present

## 2024-02-29 DIAGNOSIS — Z9049 Acquired absence of other specified parts of digestive tract: Secondary | ICD-10-CM | POA: Diagnosis not present

## 2024-02-29 DIAGNOSIS — E079 Disorder of thyroid, unspecified: Secondary | ICD-10-CM | POA: Diagnosis not present

## 2024-02-29 DIAGNOSIS — K219 Gastro-esophageal reflux disease without esophagitis: Secondary | ICD-10-CM | POA: Diagnosis not present

## 2024-02-29 DIAGNOSIS — Z79890 Hormone replacement therapy (postmenopausal): Secondary | ICD-10-CM | POA: Diagnosis not present

## 2024-02-29 DIAGNOSIS — R32 Unspecified urinary incontinence: Secondary | ICD-10-CM | POA: Diagnosis not present

## 2024-03-02 DIAGNOSIS — E119 Type 2 diabetes mellitus without complications: Secondary | ICD-10-CM | POA: Diagnosis not present

## 2024-03-02 DIAGNOSIS — E079 Disorder of thyroid, unspecified: Secondary | ICD-10-CM | POA: Diagnosis not present

## 2024-03-02 DIAGNOSIS — E042 Nontoxic multinodular goiter: Secondary | ICD-10-CM | POA: Diagnosis not present

## 2024-03-02 DIAGNOSIS — Z7984 Long term (current) use of oral hypoglycemic drugs: Secondary | ICD-10-CM | POA: Diagnosis not present

## 2024-03-02 DIAGNOSIS — Z9049 Acquired absence of other specified parts of digestive tract: Secondary | ICD-10-CM | POA: Diagnosis not present

## 2024-03-02 DIAGNOSIS — F32A Depression, unspecified: Secondary | ICD-10-CM | POA: Diagnosis not present

## 2024-03-02 DIAGNOSIS — E785 Hyperlipidemia, unspecified: Secondary | ICD-10-CM | POA: Diagnosis not present

## 2024-03-02 DIAGNOSIS — K219 Gastro-esophageal reflux disease without esophagitis: Secondary | ICD-10-CM | POA: Diagnosis not present

## 2024-03-02 DIAGNOSIS — F419 Anxiety disorder, unspecified: Secondary | ICD-10-CM | POA: Diagnosis not present

## 2024-03-02 DIAGNOSIS — R32 Unspecified urinary incontinence: Secondary | ICD-10-CM | POA: Diagnosis not present

## 2024-03-02 DIAGNOSIS — Z471 Aftercare following joint replacement surgery: Secondary | ICD-10-CM | POA: Diagnosis not present

## 2024-03-02 DIAGNOSIS — G4733 Obstructive sleep apnea (adult) (pediatric): Secondary | ICD-10-CM | POA: Diagnosis not present

## 2024-03-02 DIAGNOSIS — Z8572 Personal history of non-Hodgkin lymphomas: Secondary | ICD-10-CM | POA: Diagnosis not present

## 2024-03-02 DIAGNOSIS — I10 Essential (primary) hypertension: Secondary | ICD-10-CM | POA: Diagnosis not present

## 2024-03-02 DIAGNOSIS — Z79890 Hormone replacement therapy (postmenopausal): Secondary | ICD-10-CM | POA: Diagnosis not present

## 2024-03-02 DIAGNOSIS — Z96653 Presence of artificial knee joint, bilateral: Secondary | ICD-10-CM | POA: Diagnosis not present

## 2024-03-02 DIAGNOSIS — Z7982 Long term (current) use of aspirin: Secondary | ICD-10-CM | POA: Diagnosis not present

## 2024-03-04 DIAGNOSIS — G4733 Obstructive sleep apnea (adult) (pediatric): Secondary | ICD-10-CM | POA: Diagnosis not present

## 2024-03-04 DIAGNOSIS — F32A Depression, unspecified: Secondary | ICD-10-CM | POA: Diagnosis not present

## 2024-03-04 DIAGNOSIS — Z471 Aftercare following joint replacement surgery: Secondary | ICD-10-CM | POA: Diagnosis not present

## 2024-03-04 DIAGNOSIS — E079 Disorder of thyroid, unspecified: Secondary | ICD-10-CM | POA: Diagnosis not present

## 2024-03-04 DIAGNOSIS — I10 Essential (primary) hypertension: Secondary | ICD-10-CM | POA: Diagnosis not present

## 2024-03-04 DIAGNOSIS — Z7982 Long term (current) use of aspirin: Secondary | ICD-10-CM | POA: Diagnosis not present

## 2024-03-04 DIAGNOSIS — Z7984 Long term (current) use of oral hypoglycemic drugs: Secondary | ICD-10-CM | POA: Diagnosis not present

## 2024-03-04 DIAGNOSIS — E785 Hyperlipidemia, unspecified: Secondary | ICD-10-CM | POA: Diagnosis not present

## 2024-03-04 DIAGNOSIS — E042 Nontoxic multinodular goiter: Secondary | ICD-10-CM | POA: Diagnosis not present

## 2024-03-04 DIAGNOSIS — F419 Anxiety disorder, unspecified: Secondary | ICD-10-CM | POA: Diagnosis not present

## 2024-03-04 DIAGNOSIS — R32 Unspecified urinary incontinence: Secondary | ICD-10-CM | POA: Diagnosis not present

## 2024-03-04 DIAGNOSIS — Z79890 Hormone replacement therapy (postmenopausal): Secondary | ICD-10-CM | POA: Diagnosis not present

## 2024-03-04 DIAGNOSIS — Z8572 Personal history of non-Hodgkin lymphomas: Secondary | ICD-10-CM | POA: Diagnosis not present

## 2024-03-04 DIAGNOSIS — Z96653 Presence of artificial knee joint, bilateral: Secondary | ICD-10-CM | POA: Diagnosis not present

## 2024-03-04 DIAGNOSIS — K219 Gastro-esophageal reflux disease without esophagitis: Secondary | ICD-10-CM | POA: Diagnosis not present

## 2024-03-04 DIAGNOSIS — Z9049 Acquired absence of other specified parts of digestive tract: Secondary | ICD-10-CM | POA: Diagnosis not present

## 2024-03-04 DIAGNOSIS — E119 Type 2 diabetes mellitus without complications: Secondary | ICD-10-CM | POA: Diagnosis not present

## 2024-03-07 DIAGNOSIS — E042 Nontoxic multinodular goiter: Secondary | ICD-10-CM | POA: Diagnosis not present

## 2024-03-07 DIAGNOSIS — Z79890 Hormone replacement therapy (postmenopausal): Secondary | ICD-10-CM | POA: Diagnosis not present

## 2024-03-07 DIAGNOSIS — Z9049 Acquired absence of other specified parts of digestive tract: Secondary | ICD-10-CM | POA: Diagnosis not present

## 2024-03-07 DIAGNOSIS — I10 Essential (primary) hypertension: Secondary | ICD-10-CM | POA: Diagnosis not present

## 2024-03-07 DIAGNOSIS — M7989 Other specified soft tissue disorders: Secondary | ICD-10-CM | POA: Diagnosis not present

## 2024-03-07 DIAGNOSIS — F32A Depression, unspecified: Secondary | ICD-10-CM | POA: Diagnosis not present

## 2024-03-07 DIAGNOSIS — K219 Gastro-esophageal reflux disease without esophagitis: Secondary | ICD-10-CM | POA: Diagnosis not present

## 2024-03-07 DIAGNOSIS — F419 Anxiety disorder, unspecified: Secondary | ICD-10-CM | POA: Diagnosis not present

## 2024-03-07 DIAGNOSIS — E785 Hyperlipidemia, unspecified: Secondary | ICD-10-CM | POA: Diagnosis not present

## 2024-03-07 DIAGNOSIS — Z471 Aftercare following joint replacement surgery: Secondary | ICD-10-CM | POA: Diagnosis not present

## 2024-03-07 DIAGNOSIS — Z96651 Presence of right artificial knee joint: Secondary | ICD-10-CM | POA: Diagnosis not present

## 2024-03-07 DIAGNOSIS — Z96653 Presence of artificial knee joint, bilateral: Secondary | ICD-10-CM | POA: Diagnosis not present

## 2024-03-07 DIAGNOSIS — E079 Disorder of thyroid, unspecified: Secondary | ICD-10-CM | POA: Diagnosis not present

## 2024-03-07 DIAGNOSIS — R32 Unspecified urinary incontinence: Secondary | ICD-10-CM | POA: Diagnosis not present

## 2024-03-07 DIAGNOSIS — Z7984 Long term (current) use of oral hypoglycemic drugs: Secondary | ICD-10-CM | POA: Diagnosis not present

## 2024-03-07 DIAGNOSIS — G4733 Obstructive sleep apnea (adult) (pediatric): Secondary | ICD-10-CM | POA: Diagnosis not present

## 2024-03-07 DIAGNOSIS — Z7982 Long term (current) use of aspirin: Secondary | ICD-10-CM | POA: Diagnosis not present

## 2024-03-07 DIAGNOSIS — E119 Type 2 diabetes mellitus without complications: Secondary | ICD-10-CM | POA: Diagnosis not present

## 2024-03-07 DIAGNOSIS — Z8572 Personal history of non-Hodgkin lymphomas: Secondary | ICD-10-CM | POA: Diagnosis not present

## 2024-03-09 DIAGNOSIS — E079 Disorder of thyroid, unspecified: Secondary | ICD-10-CM | POA: Diagnosis not present

## 2024-03-09 DIAGNOSIS — E119 Type 2 diabetes mellitus without complications: Secondary | ICD-10-CM | POA: Diagnosis not present

## 2024-03-09 DIAGNOSIS — F419 Anxiety disorder, unspecified: Secondary | ICD-10-CM | POA: Diagnosis not present

## 2024-03-09 DIAGNOSIS — Z9049 Acquired absence of other specified parts of digestive tract: Secondary | ICD-10-CM | POA: Diagnosis not present

## 2024-03-09 DIAGNOSIS — F32A Depression, unspecified: Secondary | ICD-10-CM | POA: Diagnosis not present

## 2024-03-09 DIAGNOSIS — G4733 Obstructive sleep apnea (adult) (pediatric): Secondary | ICD-10-CM | POA: Diagnosis not present

## 2024-03-09 DIAGNOSIS — K219 Gastro-esophageal reflux disease without esophagitis: Secondary | ICD-10-CM | POA: Diagnosis not present

## 2024-03-09 DIAGNOSIS — Z96653 Presence of artificial knee joint, bilateral: Secondary | ICD-10-CM | POA: Diagnosis not present

## 2024-03-09 DIAGNOSIS — Z8572 Personal history of non-Hodgkin lymphomas: Secondary | ICD-10-CM | POA: Diagnosis not present

## 2024-03-09 DIAGNOSIS — I10 Essential (primary) hypertension: Secondary | ICD-10-CM | POA: Diagnosis not present

## 2024-03-09 DIAGNOSIS — R32 Unspecified urinary incontinence: Secondary | ICD-10-CM | POA: Diagnosis not present

## 2024-03-09 DIAGNOSIS — Z7984 Long term (current) use of oral hypoglycemic drugs: Secondary | ICD-10-CM | POA: Diagnosis not present

## 2024-03-09 DIAGNOSIS — E042 Nontoxic multinodular goiter: Secondary | ICD-10-CM | POA: Diagnosis not present

## 2024-03-09 DIAGNOSIS — E785 Hyperlipidemia, unspecified: Secondary | ICD-10-CM | POA: Diagnosis not present

## 2024-03-09 DIAGNOSIS — Z7982 Long term (current) use of aspirin: Secondary | ICD-10-CM | POA: Diagnosis not present

## 2024-03-09 DIAGNOSIS — Z79890 Hormone replacement therapy (postmenopausal): Secondary | ICD-10-CM | POA: Diagnosis not present

## 2024-03-09 DIAGNOSIS — Z471 Aftercare following joint replacement surgery: Secondary | ICD-10-CM | POA: Diagnosis not present

## 2024-03-10 DIAGNOSIS — M1711 Unilateral primary osteoarthritis, right knee: Secondary | ICD-10-CM | POA: Diagnosis not present

## 2024-03-14 DIAGNOSIS — M1711 Unilateral primary osteoarthritis, right knee: Secondary | ICD-10-CM | POA: Diagnosis not present

## 2024-03-16 DIAGNOSIS — M1711 Unilateral primary osteoarthritis, right knee: Secondary | ICD-10-CM | POA: Diagnosis not present

## 2024-03-20 DIAGNOSIS — G4733 Obstructive sleep apnea (adult) (pediatric): Secondary | ICD-10-CM | POA: Diagnosis not present

## 2024-03-21 DIAGNOSIS — M1711 Unilateral primary osteoarthritis, right knee: Secondary | ICD-10-CM | POA: Diagnosis not present

## 2024-03-23 DIAGNOSIS — M1711 Unilateral primary osteoarthritis, right knee: Secondary | ICD-10-CM | POA: Diagnosis not present

## 2024-03-28 DIAGNOSIS — M1711 Unilateral primary osteoarthritis, right knee: Secondary | ICD-10-CM | POA: Diagnosis not present

## 2024-03-30 DIAGNOSIS — M1711 Unilateral primary osteoarthritis, right knee: Secondary | ICD-10-CM | POA: Diagnosis not present

## 2024-04-04 DIAGNOSIS — N393 Stress incontinence (female) (male): Secondary | ICD-10-CM | POA: Diagnosis not present

## 2024-04-05 DIAGNOSIS — M1711 Unilateral primary osteoarthritis, right knee: Secondary | ICD-10-CM | POA: Diagnosis not present

## 2024-04-06 DIAGNOSIS — M1711 Unilateral primary osteoarthritis, right knee: Secondary | ICD-10-CM | POA: Diagnosis not present

## 2024-04-08 NOTE — Progress Notes (Signed)
 Hailey [name]                                          MRN: [id number]   04/08/2024   The [facility] Quality Team Specialist reviewed this patient medical record for the purposes of chart review for care gap closure. The following were reviewed: chart review for care gap closure-kidney health evaluation for diabetes:eGFR  and uACR.    [facility] Quality Team

## 2024-04-11 ENCOUNTER — Telehealth: Payer: Self-pay | Admitting: Nurse Practitioner

## 2024-04-11 DIAGNOSIS — M1711 Unilateral primary osteoarthritis, right knee: Secondary | ICD-10-CM | POA: Diagnosis not present

## 2024-04-11 NOTE — Telephone Encounter (Signed)
 Due to schedule change for Comer Rouleau, NP--patient needs to reschedule the 07/22/24 11:00 am appointment --LVM for patient to call

## 2024-04-12 NOTE — Telephone Encounter (Signed)
 Spoke with patient regarding new appointment date and time---Friday 08/19/24 at 10:30 am---Friday 07/22/24 appointment cancelled due to provider schedule change.  Will mail information to patient and she voiced her understanding

## 2024-04-13 DIAGNOSIS — M1711 Unilateral primary osteoarthritis, right knee: Secondary | ICD-10-CM | POA: Diagnosis not present

## 2024-04-18 DIAGNOSIS — M1711 Unilateral primary osteoarthritis, right knee: Secondary | ICD-10-CM | POA: Diagnosis not present

## 2024-04-20 DIAGNOSIS — G4733 Obstructive sleep apnea (adult) (pediatric): Secondary | ICD-10-CM | POA: Diagnosis not present

## 2024-04-21 DIAGNOSIS — M1711 Unilateral primary osteoarthritis, right knee: Secondary | ICD-10-CM | POA: Diagnosis not present

## 2024-04-26 DIAGNOSIS — M1711 Unilateral primary osteoarthritis, right knee: Secondary | ICD-10-CM | POA: Diagnosis not present

## 2024-04-29 DIAGNOSIS — M1711 Unilateral primary osteoarthritis, right knee: Secondary | ICD-10-CM | POA: Diagnosis not present

## 2024-05-02 DIAGNOSIS — E890 Postprocedural hypothyroidism: Secondary | ICD-10-CM | POA: Diagnosis not present

## 2024-05-03 DIAGNOSIS — M1711 Unilateral primary osteoarthritis, right knee: Secondary | ICD-10-CM | POA: Diagnosis not present

## 2024-05-09 DIAGNOSIS — M1711 Unilateral primary osteoarthritis, right knee: Secondary | ICD-10-CM | POA: Diagnosis not present

## 2024-05-10 DIAGNOSIS — H353111 Nonexudative age-related macular degeneration, right eye, early dry stage: Secondary | ICD-10-CM | POA: Diagnosis not present

## 2024-05-10 DIAGNOSIS — H348121 Central retinal vein occlusion, left eye, with retinal neovascularization: Secondary | ICD-10-CM | POA: Diagnosis not present

## 2024-05-11 DIAGNOSIS — M1711 Unilateral primary osteoarthritis, right knee: Secondary | ICD-10-CM | POA: Diagnosis not present

## 2024-07-22 ENCOUNTER — Ambulatory Visit: Admitting: Nurse Practitioner

## 2024-08-19 ENCOUNTER — Ambulatory Visit: Admitting: Nurse Practitioner
